# Patient Record
Sex: Female | Born: 1955
Health system: Southern US, Community
[De-identification: ages and names within clinical notes are randomized; demographics above are authoritative.]

## PROBLEM LIST (undated history)

## (undated) DIAGNOSIS — D649 Anemia, unspecified: Secondary | ICD-10-CM

## (undated) DIAGNOSIS — K8020 Calculus of gallbladder without cholecystitis without obstruction: Secondary | ICD-10-CM

## (undated) DIAGNOSIS — E785 Hyperlipidemia, unspecified: Secondary | ICD-10-CM

## (undated) DIAGNOSIS — R51 Headache: Secondary | ICD-10-CM

## (undated) DIAGNOSIS — K5790 Diverticulosis of intestine, part unspecified, without perforation or abscess without bleeding: Secondary | ICD-10-CM

## (undated) HISTORY — PX: TUBAL LIGATION: SHX77

## (undated) HISTORY — DX: Diverticulosis of intestine, part unspecified, without perforation or abscess without bleeding: K57.90

## (undated) HISTORY — DX: Anemia, unspecified: D64.9

## (undated) HISTORY — DX: Hyperlipidemia, unspecified: E78.5

## (undated) HISTORY — PX: OOPHORECTOMY: SHX86

## (undated) HISTORY — DX: Calculus of gallbladder without cholecystitis without obstruction: K80.20

## (undated) HISTORY — DX: Headache: R51

## (undated) HISTORY — PX: OTHER SURGICAL HISTORY: SHX169

## (undated) HISTORY — PX: TONSILLECTOMY: SUR1361

---

## 2001-01-22 ENCOUNTER — Other Ambulatory Visit: Admission: RE | Admit: 2001-01-22 | Discharge: 2001-01-22 | Payer: Self-pay | Admitting: Neurosurgery

## 2001-09-24 ENCOUNTER — Other Ambulatory Visit: Admission: RE | Admit: 2001-09-24 | Discharge: 2001-09-24 | Payer: Self-pay | Admitting: Internal Medicine

## 2001-12-12 ENCOUNTER — Encounter: Payer: Self-pay | Admitting: Internal Medicine

## 2001-12-12 ENCOUNTER — Encounter: Admission: RE | Admit: 2001-12-12 | Discharge: 2001-12-12 | Payer: Self-pay | Admitting: Internal Medicine

## 2002-10-21 ENCOUNTER — Other Ambulatory Visit: Admission: RE | Admit: 2002-10-21 | Discharge: 2002-10-21 | Payer: Self-pay | Admitting: Internal Medicine

## 2002-12-19 ENCOUNTER — Encounter: Admission: RE | Admit: 2002-12-19 | Discharge: 2002-12-19 | Payer: Self-pay | Admitting: Internal Medicine

## 2003-10-31 ENCOUNTER — Ambulatory Visit: Payer: Self-pay | Admitting: Internal Medicine

## 2003-11-18 ENCOUNTER — Ambulatory Visit: Payer: Self-pay | Admitting: Internal Medicine

## 2003-11-18 ENCOUNTER — Other Ambulatory Visit: Admission: RE | Admit: 2003-11-18 | Discharge: 2003-11-18 | Payer: Self-pay | Admitting: Internal Medicine

## 2003-11-26 ENCOUNTER — Ambulatory Visit: Payer: Self-pay | Admitting: Gastroenterology

## 2003-12-06 ENCOUNTER — Ambulatory Visit: Payer: Self-pay | Admitting: Internal Medicine

## 2003-12-08 ENCOUNTER — Ambulatory Visit: Payer: Self-pay | Admitting: Gastroenterology

## 2004-02-05 ENCOUNTER — Encounter: Admission: RE | Admit: 2004-02-05 | Discharge: 2004-02-05 | Payer: Self-pay | Admitting: Internal Medicine

## 2004-09-11 ENCOUNTER — Emergency Department (HOSPITAL_COMMUNITY): Admission: EM | Admit: 2004-09-11 | Discharge: 2004-09-12 | Payer: Self-pay | Admitting: Emergency Medicine

## 2004-09-13 ENCOUNTER — Ambulatory Visit: Payer: Self-pay | Admitting: Internal Medicine

## 2004-10-04 ENCOUNTER — Ambulatory Visit: Payer: Self-pay | Admitting: Internal Medicine

## 2005-01-06 ENCOUNTER — Ambulatory Visit: Payer: Self-pay | Admitting: Internal Medicine

## 2005-01-13 ENCOUNTER — Other Ambulatory Visit: Admission: RE | Admit: 2005-01-13 | Discharge: 2005-01-13 | Payer: Self-pay | Admitting: Neurosurgery

## 2005-01-13 ENCOUNTER — Encounter: Payer: Self-pay | Admitting: Internal Medicine

## 2005-01-13 ENCOUNTER — Ambulatory Visit: Payer: Self-pay | Admitting: Internal Medicine

## 2005-02-16 ENCOUNTER — Encounter: Admission: RE | Admit: 2005-02-16 | Discharge: 2005-02-16 | Payer: Self-pay | Admitting: Internal Medicine

## 2005-04-12 ENCOUNTER — Ambulatory Visit: Payer: Self-pay | Admitting: Cardiology

## 2005-09-29 ENCOUNTER — Ambulatory Visit: Payer: Self-pay | Admitting: Internal Medicine

## 2005-10-03 ENCOUNTER — Ambulatory Visit: Payer: Self-pay | Admitting: Internal Medicine

## 2005-10-20 ENCOUNTER — Other Ambulatory Visit: Admission: RE | Admit: 2005-10-20 | Discharge: 2005-10-20 | Payer: Self-pay | Admitting: Internal Medicine

## 2005-10-20 ENCOUNTER — Ambulatory Visit: Payer: Self-pay | Admitting: Internal Medicine

## 2005-10-20 ENCOUNTER — Encounter (INDEPENDENT_AMBULATORY_CARE_PROVIDER_SITE_OTHER): Payer: Self-pay | Admitting: *Deleted

## 2006-02-21 ENCOUNTER — Encounter: Admission: RE | Admit: 2006-02-21 | Discharge: 2006-02-21 | Payer: Self-pay | Admitting: Internal Medicine

## 2006-02-27 ENCOUNTER — Ambulatory Visit: Payer: Self-pay | Admitting: Internal Medicine

## 2006-02-27 LAB — CONVERTED CEMR LAB
ALT: 61 units/L — ABNORMAL HIGH (ref 0–40)
AST: 30 units/L (ref 0–37)
Alkaline Phosphatase: 89 units/L (ref 39–117)
BUN: 17 mg/dL (ref 6–23)
CO2: 31 meq/L (ref 19–32)
Chloride: 109 meq/L (ref 96–112)
Cholesterol: 253 mg/dL (ref 0–200)
Creatinine, Ser: 0.7 mg/dL (ref 0.4–1.2)
Potassium: 4.2 meq/L (ref 3.5–5.1)
Total Bilirubin: 0.8 mg/dL (ref 0.3–1.2)
Total CHOL/HDL Ratio: 2.7
Total Protein: 6.9 g/dL (ref 6.0–8.3)

## 2006-03-06 ENCOUNTER — Ambulatory Visit: Payer: Self-pay | Admitting: Internal Medicine

## 2006-11-01 ENCOUNTER — Ambulatory Visit: Payer: Self-pay | Admitting: Internal Medicine

## 2006-11-01 LAB — CONVERTED CEMR LAB
AST: 26 units/L (ref 0–37)
Albumin: 3.7 g/dL (ref 3.5–5.2)
Alkaline Phosphatase: 105 units/L (ref 39–117)
BUN: 10 mg/dL (ref 6–23)
Basophils Absolute: 0 10*3/uL (ref 0.0–0.1)
Basophils Relative: 0.7 % (ref 0.0–1.0)
CO2: 30 meq/L (ref 19–32)
Chloride: 107 meq/L (ref 96–112)
Creatinine, Ser: 0.7 mg/dL (ref 0.4–1.2)
Glucose, Urine, Semiquant: NEGATIVE
HCT: 41 % (ref 36.0–46.0)
Hemoglobin: 14.3 g/dL (ref 12.0–15.0)
LDL Cholesterol: 145 mg/dL
Monocytes Absolute: 0.7 10*3/uL (ref 0.2–0.7)
Neutrophils Relative %: 63.4 % (ref 43.0–77.0)
Nitrite: NEGATIVE
Potassium: 4.2 meq/L (ref 3.5–5.1)
RBC: 3.98 M/uL (ref 3.87–5.11)
RDW: 13.3 % (ref 11.5–14.6)
Sodium: 141 meq/L (ref 135–145)
TSH: 1.66 microintl units/mL (ref 0.35–5.50)
Total Bilirubin: 0.6 mg/dL (ref 0.3–1.2)
Total CHOL/HDL Ratio: 3
Total Protein: 6.3 g/dL (ref 6.0–8.3)
Triglycerides: 93 mg/dL (ref 0–149)
Urobilinogen, UA: 0.2
VLDL: 19 mg/dL (ref 0–40)
WBC: 5.4 10*3/uL (ref 4.5–10.5)

## 2007-01-23 ENCOUNTER — Ambulatory Visit: Payer: Self-pay | Admitting: Internal Medicine

## 2007-01-23 DIAGNOSIS — K648 Other hemorrhoids: Secondary | ICD-10-CM | POA: Insufficient documentation

## 2007-01-23 DIAGNOSIS — K5730 Diverticulosis of large intestine without perforation or abscess without bleeding: Secondary | ICD-10-CM | POA: Insufficient documentation

## 2007-01-23 DIAGNOSIS — E785 Hyperlipidemia, unspecified: Secondary | ICD-10-CM | POA: Insufficient documentation

## 2007-01-23 DIAGNOSIS — R519 Headache, unspecified: Secondary | ICD-10-CM | POA: Insufficient documentation

## 2007-01-23 DIAGNOSIS — R51 Headache: Secondary | ICD-10-CM | POA: Insufficient documentation

## 2007-01-23 DIAGNOSIS — M810 Age-related osteoporosis without current pathological fracture: Secondary | ICD-10-CM | POA: Insufficient documentation

## 2007-01-23 LAB — CONVERTED CEMR LAB: HDL goal, serum: 40 mg/dL

## 2007-03-08 ENCOUNTER — Encounter: Admission: RE | Admit: 2007-03-08 | Discharge: 2007-03-08 | Payer: Self-pay | Admitting: Internal Medicine

## 2007-04-17 ENCOUNTER — Ambulatory Visit: Payer: Self-pay | Admitting: Internal Medicine

## 2007-04-17 DIAGNOSIS — D649 Anemia, unspecified: Secondary | ICD-10-CM | POA: Insufficient documentation

## 2007-04-17 LAB — CONVERTED CEMR LAB
Basophils Relative: 0.5 % (ref 0.0–1.0)
Eosinophils Relative: 0.9 % (ref 0.0–5.0)
HCT: 42.4 % (ref 36.0–46.0)
Hemoglobin: 14.5 g/dL (ref 12.0–15.0)
Lymphocytes Relative: 28 % (ref 12.0–46.0)
Monocytes Absolute: 0.6 10*3/uL (ref 0.1–1.0)
Monocytes Relative: 9.4 % (ref 3.0–12.0)
Neutro Abs: 3.8 10*3/uL (ref 1.4–7.7)
RBC: 4.12 M/uL (ref 3.87–5.11)

## 2007-04-24 ENCOUNTER — Ambulatory Visit: Payer: Self-pay | Admitting: Internal Medicine

## 2007-04-24 DIAGNOSIS — E538 Deficiency of other specified B group vitamins: Secondary | ICD-10-CM | POA: Insufficient documentation

## 2007-04-24 DIAGNOSIS — E162 Hypoglycemia, unspecified: Secondary | ICD-10-CM | POA: Insufficient documentation

## 2007-04-30 ENCOUNTER — Ambulatory Visit: Payer: Self-pay | Admitting: Internal Medicine

## 2007-04-30 ENCOUNTER — Telehealth: Payer: Self-pay | Admitting: *Deleted

## 2007-04-30 LAB — CONVERTED CEMR LAB
Glucose, 1 Hour GTT: 104 mg/dL — ABNORMAL LOW (ref 120–170)
Glucose, 2 hour: 62 mg/dL — ABNORMAL LOW (ref 70–139)
Glucose, GTT - 3 Hour: 72 mg/dL

## 2007-05-10 ENCOUNTER — Encounter: Payer: Self-pay | Admitting: Internal Medicine

## 2007-05-29 ENCOUNTER — Telehealth: Payer: Self-pay | Admitting: Internal Medicine

## 2007-06-07 ENCOUNTER — Encounter: Payer: Self-pay | Admitting: Internal Medicine

## 2007-07-25 ENCOUNTER — Ambulatory Visit: Payer: Self-pay | Admitting: Internal Medicine

## 2007-07-25 LAB — CONVERTED CEMR LAB: Vit D, 1,25-Dihydroxy: 36 (ref 30–89)

## 2007-10-11 ENCOUNTER — Ambulatory Visit: Payer: Self-pay | Admitting: Internal Medicine

## 2007-10-17 ENCOUNTER — Telehealth: Payer: Self-pay | Admitting: Internal Medicine

## 2007-10-24 LAB — CONVERTED CEMR LAB: Vit D, 1,25-Dihydroxy: 31 (ref 30–89)

## 2007-12-03 ENCOUNTER — Telehealth: Payer: Self-pay | Admitting: Internal Medicine

## 2007-12-25 ENCOUNTER — Telehealth: Payer: Self-pay | Admitting: *Deleted

## 2008-02-29 ENCOUNTER — Ambulatory Visit: Payer: Self-pay | Admitting: Internal Medicine

## 2008-02-29 LAB — CONVERTED CEMR LAB
ALT: 64 units/L — ABNORMAL HIGH (ref 0–35)
AST: 51 units/L — ABNORMAL HIGH (ref 0–37)
Albumin: 4.2 g/dL (ref 3.5–5.2)
BUN: 19 mg/dL (ref 6–23)
Basophils Relative: 0.5 % (ref 0.0–3.0)
Blood in Urine, dipstick: NEGATIVE
CO2: 29 meq/L (ref 19–32)
Calcium: 9.5 mg/dL (ref 8.4–10.5)
Chloride: 106 meq/L (ref 96–112)
Creatinine, Ser: 0.6 mg/dL (ref 0.4–1.2)
Creatinine,U: 122.4 mg/dL
Eosinophils Absolute: 0.1 10*3/uL (ref 0.0–0.7)
Eosinophils Relative: 1.2 % (ref 0.0–5.0)
Glucose, Urine, Semiquant: NEGATIVE
HDL: 97.1 mg/dL (ref >39.0)
Hemoglobin: 15 g/dL (ref 12.0–15.0)
MCV: 102.3 fL — ABNORMAL HIGH (ref 78.0–100.0)
Neutro Abs: 2.5 10*3/uL (ref 1.4–7.7)
Neutrophils Relative %: 60.1 % (ref 43.0–77.0)
Nitrite: NEGATIVE
RBC: 4.3 M/uL (ref 3.87–5.11)
Specific Gravity, Urine: 1.025
WBC Urine, dipstick: NEGATIVE
WBC: 4.3 10*3/uL — ABNORMAL LOW (ref 4.5–10.5)
pH: 5

## 2008-03-12 ENCOUNTER — Ambulatory Visit: Payer: Self-pay | Admitting: Internal Medicine

## 2008-03-12 DIAGNOSIS — R079 Chest pain, unspecified: Secondary | ICD-10-CM | POA: Insufficient documentation

## 2008-03-12 DIAGNOSIS — R7401 Elevation of levels of liver transaminase levels: Secondary | ICD-10-CM | POA: Insufficient documentation

## 2008-03-12 DIAGNOSIS — R740 Nonspecific elevation of levels of transaminase and lactic acid dehydrogenase [LDH]: Secondary | ICD-10-CM

## 2008-03-13 ENCOUNTER — Telehealth: Payer: Self-pay | Admitting: Internal Medicine

## 2008-03-19 ENCOUNTER — Ambulatory Visit: Payer: Self-pay

## 2008-03-19 ENCOUNTER — Encounter: Payer: Self-pay | Admitting: Internal Medicine

## 2008-03-21 ENCOUNTER — Encounter: Admission: RE | Admit: 2008-03-21 | Discharge: 2008-03-21 | Payer: Self-pay | Admitting: Internal Medicine

## 2008-03-21 ENCOUNTER — Telehealth: Payer: Self-pay | Admitting: Internal Medicine

## 2008-03-24 ENCOUNTER — Telehealth (INDEPENDENT_AMBULATORY_CARE_PROVIDER_SITE_OTHER): Payer: Self-pay | Admitting: *Deleted

## 2009-01-21 ENCOUNTER — Ambulatory Visit: Payer: Self-pay | Admitting: Internal Medicine

## 2009-12-04 ENCOUNTER — Ambulatory Visit: Payer: Self-pay | Admitting: Internal Medicine

## 2009-12-04 LAB — CONVERTED CEMR LAB
ALT: 35 units/L (ref 0–35)
BUN: 16 mg/dL (ref 6–23)
Bilirubin Urine: NEGATIVE
Bilirubin, Direct: 0.1 mg/dL (ref 0.0–0.3)
Blood in Urine, dipstick: NEGATIVE
Chloride: 105 meq/L (ref 96–112)
Cholesterol: 232 mg/dL — ABNORMAL HIGH (ref 0–200)
Creatinine, Ser: 0.7 mg/dL (ref 0.4–1.2)
Creatinine,U: 183.7 mg/dL
Direct LDL: 112 mg/dL
Eosinophils Relative: 0.9 % (ref 0.0–5.0)
GFR calc non Af Amer: 98.18 mL/min (ref >60)
Glucose, Urine, Semiquant: NEGATIVE
HDL: 87.8 mg/dL (ref >39.00)
Hgb A1c MFr Bld: 5.3 % (ref 4.6–6.5)
Ketones, urine, test strip: NEGATIVE
MCV: 103.9 fL — ABNORMAL HIGH (ref 78.0–100.0)
Microalb Creat Ratio: 0.5 mg/g (ref 0.0–30.0)
Microalb, Ur: 0.9 mg/dL (ref 0.0–1.9)
Monocytes Absolute: 0.5 10*3/uL (ref 0.1–1.0)
Neutrophils Relative %: 63.2 % (ref 43.0–77.0)
Platelets: 244 10*3/uL (ref 150.0–400.0)
Protein, U semiquant: NEGATIVE
Total Bilirubin: 0.7 mg/dL (ref 0.3–1.2)
Triglycerides: 80 mg/dL (ref 0.0–149.0)
VLDL: 16 mg/dL (ref 0.0–40.0)
WBC: 4.8 10*3/uL (ref 4.5–10.5)
pH: 5

## 2009-12-09 ENCOUNTER — Ambulatory Visit: Payer: Self-pay | Admitting: Internal Medicine

## 2010-02-02 NOTE — Assessment & Plan Note (Signed)
Summary: CPX // RS   Vital Signs:  Patient profile:   55 year old female Height:      63 inches Weight:      158 pounds BMI:     28.09 Temp:     98.2 degrees F oral Pulse rate:   72 / minute Resp:     14 per minute BP sitting:   120 / 80  (left arm)  Vitals Entered By: Willy Eddy, LPN (December 09, 2009 3:02 PM) CC: cpx Is Patient Diabetic? No   CC:  cpx.  Preventive Screening-Counseling & Management  Alcohol-Tobacco     Smoking Status: quit     Year Quit: 2002     Passive Smoke Exposure: no  Current Problems (verified): 1)  Transaminases, Serum, Elevated  (ICD-790.4) 2)  Chest Pain Unspecified  (ICD-786.50) 3)  Osteoporosis  (ICD-733.00) 4)  Vitamin B12 Deficiency  (ICD-266.2) 5)  Reactive Hypoglycemia  (ICD-251.2) 6)  Anemia  (ICD-285.9) 7)  Preventive Health Care  (ICD-V70.0) 8)  Internal Hemorrhoids  (ICD-455.0) 9)  Diverticulosis, Colon  (ICD-562.10) 10)  Family History Diabetes 1st Degree Relative  (ICD-V18.0) 11)  Family History of Cad Female 1st Degree Relative <50  (ICD-V17.3) 12)  Headache  (ICD-784.0) 13)  Hyperlipidemia  (ICD-272.4) 14)  Osteoporosis  (ICD-733.00)  Current Medications (verified): 1)  Butalbital-Apap-Caffeine 50-500-40 Mg Tabs (Butalbital-Apap-Caffeine) .Marland Kitchen.. 1 Every 6-8 H Ours As Needed Headache  Allergies (verified): 1)  Asa  Contraindications/Deferment of Procedures/Staging:    Test/Procedure: FLU VAX    Reason for deferment: patient declined   Past History:  Family History: Last updated: 03/12/2008 mother Family History Hypertensionthe father died at 42 Family History of CAD Female 1st degree relative <50 Family History Diabetes 1st degree relative twoprother with stents for CAD  Social History: Last updated: 01/23/2007 Married Former Smoker Alcohol use-yes Drug use-no Regular exercise-yes  Risk Factors: Exercise: yes (01/23/2007)  Risk Factors: Smoking Status: quit (12/09/2009) Passive Smoke Exposure: no  (12/09/2009)  Past medical, surgical, family and social histories (including risk factors) reviewed, and no changes noted (except as noted below).  Past Medical History: Reviewed history from 10/11/2007 and no changes required. Osteoporosis Hyperlipidemia HSV Headache Diverticulosis, colon ? glaucoma  Past Surgical History: Reviewed history from 01/23/2007 and no changes required. Oophorectomy Tonsillectomy matuateratoma  Family History: Reviewed history from 03/12/2008 and no changes required. mother Family History Hypertensionthe father died at 67 Family History of CAD Female 1st degree relative <50 Family History Diabetes 1st degree relative twoprother with stents for CAD  Social History: Reviewed history from 01/23/2007 and no changes required. Married Former Smoker Alcohol use-yes Drug use-no Regular exercise-yes  Review of Systems  The patient denies anorexia, fever, weight loss, weight gain, vision loss, decreased hearing, hoarseness, chest pain, syncope, dyspnea on exertion, peripheral edema, prolonged cough, headaches, hemoptysis, abdominal pain, melena, hematochezia, severe indigestion/heartburn, hematuria, incontinence, genital sores, muscle weakness, suspicious skin lesions, transient blindness, difficulty walking, depression, unusual weight change, abnormal bleeding, enlarged lymph nodes, angioedema, and breast masses.    Physical Exam  General:  Well-developed,well-nourished,in no acute distress; alert,appropriate and cooperative throughout examination Head:  Normocephalic and atraumatic without obvious abnormalities. No apparent alopecia or balding. Eyes:  No corneal or conjunctival inflammation noted. EOMI. Perrla. Funduscopic exam benign, without hemorrhages, exudates or papilledema. Vision grossly normal. Ears:  R ear normal and L ear normal.   Nose:  no external deformity and no nasal discharge.   Mouth:  good dentition and pharynx pink and moist.  Neck:  No deformities, masses, or tenderness noted. Lungs:  Normal respiratory effort, chest expands symmetrically. Lungs are clear to auscultation, no crackles or wheezes. Heart:  Normal rate and regular rhythm. S1 and S2 normal without gallop, murmur, click, rub or other extra sounds. Abdomen:  soft and non-tender.   Msk:  No deformity or scoliosis noted of thoracic or lumbar spine.   Pulses:  R and L carotid,radial,femoral,dorsalis pedis and posterior tibial pulses are full and equal bilaterally Extremities:  No clubbing, cyanosis, edema, or deformity noted with normal full range of motion of all joints.   Neurologic:  alert & oriented X3 and cranial nerves II-XII intact.     Impression & Recommendations:  Problem # 1:  TRANSAMINASES, SERUM, ELEVATED (ICD-790.4) resolved  Problem # 2:  REACTIVE HYPOGLYCEMIA (ICD-251.2) stable  Problem # 3:  ANEMIA (ICD-285.9) resolved The following medications were removed from the medication list:    Vitamin B-12 Cr 1000 Mcg Tbcr (Cyanocobalamin) ..... One by mouth daily  Problem # 4:  HEADACHE (ICD-784.0)  Her updated medication list for this problem includes:    Butalbital-apap-caffeine 50-500-40 Mg Tabs (Butalbital-apap-caffeine) .Marland Kitchen... 2 every 6-8 h ours as needed headache  Headache diary reviewed.  Problem # 5:  PREVENTIVE HEALTH CARE (ICD-V70.0) The pt was asked about all immunizations, health maint. services that are appropriate to their age and was given guidance on diet exercize  and weight management  Mammogram: normal (04/02/2009) Pap smear: normal (04/02/2009) Colonoscopy: normal (01/02/2004) Td Booster: Td (03/28/2003)   Flu Vax: Fluvax Non-MCR (01/23/2007)   Chol: 232 (12/04/2009)   HDL: 87.80 (12/04/2009)   LDL: DEL (02/29/2008)   TG: 80.0 (12/04/2009) TSH: 1.70 (12/04/2009)   HgbA1C: 5.3 (12/04/2009)   Next mammogram due:: 04/2010 (12/09/2009) Next Colonoscopy due:: 01/2011 (12/09/2009)  Discussed using sunscreen, use of  alcohol, drug use, self breast exam, routine dental care, routine eye care, schedule for GYN exam, routine physical exam, seat belts, multiple vitamins, osteoporosis prevention, adequate calcium intake in diet, recommendations for immunizations, mammograms and Pap smears.  Discussed exercise and checking cholesterol.  Discussed gun safety, safe sex, and contraception.  Complete Medication List: 1)  Butalbital-apap-caffeine 50-500-40 Mg Tabs (Butalbital-apap-caffeine) .... 2 every 6-8 h ours as needed headache  Patient Instructions: 1)  Please schedule a follow-up appointment in 1 year. Prescriptions: BUTALBITAL-APAP-CAFFEINE 50-500-40 MG TABS (BUTALBITAL-APAP-CAFFEINE) 2 every 6-8 h ours as needed headache  #60 x 3   Entered and Authorized by:   Stacie Glaze MD   Signed by:   Stacie Glaze MD on 12/09/2009   Method used:   Electronically to        [facility] # [phone]* (retail)       [address]       [place], [place]  [address]       Ph: [phone]       Fax: [phone]   RxID:   [id number]    Orders Added: 1)  Est. Patient 40-64 years [99396] 2)  Est. Patient Level III [27253]     Preventive Care Screening  Colonoscopy:    Date:  01/02/2004    Next Due:  01/2011    Results:  normal   Pap Smear:    Date:  04/02/2009    Next Due:  04/2010    Results:  normal   Mammogram:    Date:  04/02/2009    Next Due:  04/2010    Results:  normal

## 2010-02-17 ENCOUNTER — Telehealth: Payer: Self-pay | Admitting: Internal Medicine

## 2010-02-17 DIAGNOSIS — R110 Nausea: Secondary | ICD-10-CM

## 2010-02-17 MED ORDER — CIPROFLOXACIN HCL 500 MG PO TABS: 500.0000 mg | ORAL_TABLET | Freq: Two times a day (BID) | ORAL | Status: AC

## 2010-02-17 MED ORDER — PROMETHAZINE HCL 12.5 MG PO TABS: 25.0000 mg | ORAL_TABLET | Freq: Four times a day (QID) | ORAL | Status: DC | PRN

## 2010-02-17 NOTE — Telephone Encounter (Signed)
Please advise 

## 2010-02-17 NOTE — Telephone Encounter (Signed)
cipro 500 bid for 7 days and  Phenergan 25 mg po prn number 10

## 2010-02-17 NOTE — Telephone Encounter (Signed)
ok 

## 2010-02-17 NOTE — Telephone Encounter (Signed)
Pt is going out of country [place]  for 1 wk she is requesting a trip kit. [facility] [address]

## 2010-05-21 NOTE — Assessment & Plan Note (Signed)
[facility]                                 ON-CALL NOTE   NAME:Anglemyer, Naelle                           MRN:          [id number]  DATE:11/05/2006                            DOB:          05/19/1955    TIME OF INTERACTION:  8:05 a.m.   PHONE NUMBER:  [phone].   SUBJECTIVE:  The patient has an earache with a sore throat.  Started  Friday, got better yesterday, and now is back again today.  She would  like a Z-PAK called in.   OBJECTIVE:  Probable URI, probable ear infection.   PLAN:  I tried to explain to her that we do not call in antibiotics over  the phone.  If she feels she needs to be seen today, she needs to go to  an acute care or the emergency room.  Otherwise, call in the morning for  an appointment if she is not improved, and they will be glad to see her.   PRIMARY CARE Johnjoseph Rolfe:  Dr. Lovell Sheehan.  Home office is [place].     Arta Silence, MD  Electronically Signed    RNS/MedQ  DD: 11/05/2006  DT: 11/06/2006  Job #: [id number]

## 2010-07-20 ENCOUNTER — Other Ambulatory Visit: Payer: Self-pay | Admitting: Obstetrics and Gynecology

## 2010-08-06 ENCOUNTER — Other Ambulatory Visit: Payer: Self-pay | Admitting: Internal Medicine

## 2010-12-08 ENCOUNTER — Telehealth: Payer: Self-pay | Admitting: Internal Medicine

## 2010-12-08 DIAGNOSIS — R109 Unspecified abdominal pain: Secondary | ICD-10-CM

## 2010-12-08 NOTE — Telephone Encounter (Addendum)
Pt is having abd pains last attack was Saturday at 530pm. Pt decline to see another MD. Pt would like ultrasound ?gallbladder problem.

## 2010-12-08 NOTE — Telephone Encounter (Signed)
Per dr Lovell Sheehan- may have [place] of abd -pt informed--instrcuted to go to er if increased abd pain

## 2010-12-10 ENCOUNTER — Ambulatory Visit
Admission: RE | Admit: 2010-12-10 | Discharge: 2010-12-10 | Disposition: A | Discharge status: Home / Self Care | Payer: BC Managed Care – PPO | Source: Ambulatory Visit | Attending: Internal Medicine | Admitting: Internal Medicine

## 2010-12-10 DIAGNOSIS — R109 Unspecified abdominal pain: Secondary | ICD-10-CM

## 2010-12-11 ENCOUNTER — Encounter: Payer: Self-pay | Admitting: Gastroenterology

## 2010-12-13 ENCOUNTER — Telehealth: Payer: Self-pay | Admitting: *Deleted

## 2010-12-13 NOTE — Telephone Encounter (Signed)
ok 

## 2011-01-27 ENCOUNTER — Telehealth: Payer: Self-pay | Admitting: Internal Medicine

## 2011-01-27 NOTE — Telephone Encounter (Signed)
Refill Butat/Apap to [facility]. Thanks.

## 2011-01-28 ENCOUNTER — Other Ambulatory Visit: Payer: Self-pay | Admitting: *Deleted

## 2011-01-28 MED ORDER — BUTALBITAL-APAP-CAFFEINE 50-500-40 MG PO TABS: 2.0000 | ORAL_TABLET | Freq: Four times a day (QID) | ORAL | Status: DC | PRN

## 2011-02-04 ENCOUNTER — Other Ambulatory Visit (INDEPENDENT_AMBULATORY_CARE_PROVIDER_SITE_OTHER): Payer: BC Managed Care – PPO

## 2011-02-04 DIAGNOSIS — Z0000 Encounter for general adult medical examination without abnormal findings: Secondary | ICD-10-CM

## 2011-02-04 LAB — POCT URINALYSIS DIPSTICK
Blood, UA: NEGATIVE
Glucose, UA: NEGATIVE
Nitrite, UA: NEGATIVE
Protein, UA: NEGATIVE
Urobilinogen, UA: 0.2

## 2011-02-04 LAB — HEPATIC FUNCTION PANEL
ALT: 40 U/L — ABNORMAL HIGH (ref 0–35)
AST: 22 U/L (ref 0–37)
Bilirubin, Direct: 0 mg/dL (ref 0.0–0.3)
Total Bilirubin: 0.5 mg/dL (ref 0.3–1.2)

## 2011-02-04 LAB — LIPID PANEL
Cholesterol: 218 mg/dL — ABNORMAL HIGH (ref 0–200)
HDL: 79.2 mg/dL (ref >39.00)
Total CHOL/HDL Ratio: 3
VLDL: 16.2 mg/dL (ref 0.0–40.0)

## 2011-02-04 LAB — CBC WITH DIFFERENTIAL/PLATELET
Basophils Absolute: 0 10*3/uL (ref 0.0–0.1)
Eosinophils Relative: 1.5 % (ref 0.0–5.0)
Monocytes Relative: 10.4 % (ref 3.0–12.0)
Neutrophils Relative %: 60.2 % (ref 43.0–77.0)
Platelets: 233 10*3/uL (ref 150.0–400.0)
WBC: 4.2 10*3/uL — ABNORMAL LOW (ref 4.5–10.5)

## 2011-02-04 LAB — BASIC METABOLIC PANEL
BUN: 12 mg/dL (ref 6–23)
Calcium: 9 mg/dL (ref 8.4–10.5)
Creatinine, Ser: 0.7 mg/dL (ref 0.4–1.2)
GFR: 88.43 mL/min (ref >60.00)

## 2011-02-11 ENCOUNTER — Ambulatory Visit (INDEPENDENT_AMBULATORY_CARE_PROVIDER_SITE_OTHER): Payer: BC Managed Care – PPO | Admitting: Internal Medicine

## 2011-02-11 ENCOUNTER — Encounter: Payer: Self-pay | Admitting: Internal Medicine

## 2011-02-11 DIAGNOSIS — Z0000 Encounter for general adult medical examination without abnormal findings: Secondary | ICD-10-CM

## 2011-02-11 DIAGNOSIS — K8020 Calculus of gallbladder without cholecystitis without obstruction: Secondary | ICD-10-CM

## 2011-02-11 DIAGNOSIS — R7402 Elevation of levels of lactic acid dehydrogenase (LDH): Secondary | ICD-10-CM

## 2011-02-11 DIAGNOSIS — R7401 Elevation of levels of liver transaminase levels: Secondary | ICD-10-CM

## 2011-02-11 NOTE — Patient Instructions (Addendum)
The patient is instructed to continue all medications as prescribed. Schedule followup with check out clerk upon leaving the clinic Cholelithiasis Cholelithiasis (also called gallstones) is a form of gallbladder disease where gallstones form in your gallbladder. The gallbladder is a non-essential organ that stores bile made in the liver, which helps digest fats. Gallstones begin as small crystals and slowly grow into stones. Gallstone pain occurs when the gallbladder spasms, and a gallstone is blocking the duct. Pain can also occur when a stone passes out of the duct.  Women are more likely to develop gallstones than men. Other factors that increase the risk of gallbladder disease are:  Having multiple pregnancies. Physicians sometimes advise removing diseased gallbladders before future pregnancies.   Obesity.   Diets heavy in fried foods and fat.   Increasing age (older than 91).   Prolonged use of medications containing female hormones.   Diabetes mellitus.   Rapid weight loss.   Family history of gallstones (heredity).  SYMPTOMS  Feeling sick to your stomach (nauseous).   Abdominal pain.   Yellowing of the skin (jaundice).   Sudden pain. It may persist from several minutes to several hours.   Worsening pain with deep breathing or when jarred.   Fever.   Tenderness to the touch.  In some cases, when gallstones do not move into the bile duct, people have no pain or symptoms. These are called "silent" gallstones. TREATMENT In severe cases, emergency surgery may be required. HOME CARE INSTRUCTIONS   Only take over-the-counter or prescription medicines for pain, discomfort, or fever as directed by your caregiver.   Follow a low-fat diet until seen again. Fat causes the gallbladder to contract, which can result in pain.   Follow up as instructed. Attacks are almost always recurrent and surgery is usually required for permanent treatment.  SEEK IMMEDIATE MEDICAL CARE IF:    Your pain increases and is not controlled by medications.   You have an oral temperature above 102 F (38.9 C), not controlled by medication.   You develop nausea and vomiting.  MAKE SURE YOU:   Understand these instructions.   Will watch your condition.   Will get help right away if you are not doing well or get worse.  Document Released: 12/16/2004 Document Revised: 09/01/2010 Document Reviewed: 02/18/2010 [facility] Patient Information 2012 [place], [place].

## 2011-02-11 NOTE — Progress Notes (Signed)
Subjective:    Patient ID: Amy Doyle, female    DOB: 1955-04-15, 56 y.o.   MRN: [id number]  HPI Patient is a 56 year old white female who presents for complete physical examination.  She has an history of gallbladder is another ultrasound done in 2002 and showed mild fatty liver  and hemangioma, after recent attack she had a repeat ultrasound showing about 10 small gallstones persistent fatty liver that had not progressed and hemangioma but has not increased in size.   Review of Systems  Constitutional: Negative for activity change, appetite change and fatigue.  HENT: Negative for ear pain, congestion, neck pain, postnasal drip and sinus pressure.   Eyes: Negative for redness and visual disturbance.  Respiratory: Negative for cough, shortness of breath and wheezing.   Gastrointestinal: Negative for abdominal pain and abdominal distention.  Genitourinary: Negative for dysuria, frequency and menstrual problem.  Musculoskeletal: Negative for myalgias, joint swelling and arthralgias.  Skin: Negative for rash and wound.  Neurological: Negative for dizziness, weakness and headaches.  Hematological: Negative for adenopathy. Does not bruise/bleed easily.  Psychiatric/Behavioral: Negative for sleep disturbance and decreased concentration.   Past Medical History  Diagnosis Date  . Osteoporosis   . Hyperlipidemia   . HSV (herpes simplex virus) anogenital infection   . Headache   . Diverticulosis   . Glaucoma     History   Social History  . Marital Status: Married    Spouse Name: N/A    Number of Children: N/A  . Years of Education: N/A   Occupational History  . Not on file.   Social History Main Topics  . Smoking status: Former Games developer  . Smokeless tobacco: Not on file  . Alcohol Use: Yes  . Drug Use: No  . Sexually Active: Not on file   Other Topics Concern  . Not on file   Social History Narrative  . No narrative on file    Past Surgical History  Procedure Date   . Oophorectomy   . Tonsillectomy   . Matuateratoma     Family History  Problem Relation Age of Onset  . Diabetes    . Hypertension Mother   . Heart disease Brother   . Heart disease Father     Allergies  Allergen Reactions  . Aspirin     REACTION: rash    Current Outpatient Prescriptions on File Prior to Visit  Medication Sig Dispense Refill  . butalbital-acetaminophen-caffeine (ESGIC PLUS) 50-500-40 MG per tablet Take 2 tablets by mouth every 6 (six) hours as needed for pain.  60 tablet  2  . promethazine (PHENERGAN) 12.5 MG tablet Take 2 tablets (25 mg total) by mouth every 6 (six) hours as needed for Nausea.  10 tablet  0    BP 110/70  Pulse 72  Temp 98.3 F (36.8 C)  Resp 16  Ht 5\' 3"  (1.6 m)  Wt 144 lb (65.318 kg)  BMI 25.51 kg/m2       Objective:   Physical Exam  Constitutional: She is oriented to person, place, and time. She appears well-developed and well-nourished. No distress.  HENT:  Head: Normocephalic and atraumatic.  Right Ear: External ear normal.  Left Ear: External ear normal.  Nose: Nose normal.  Mouth/Throat: Oropharynx is clear and moist.  Eyes: Conjunctivae and EOM are normal. Pupils are equal, round, and reactive to light.  Neck: Normal range of motion. Neck supple. No JVD present. No tracheal deviation present. No thyromegaly present.  Cardiovascular: Normal rate, regular rhythm,  normal heart sounds and intact distal pulses.   No murmur heard. Pulmonary/Chest: Effort normal and breath sounds normal. She has no wheezes. She exhibits no tenderness.  Abdominal: Soft. Bowel sounds are normal.  Musculoskeletal: Normal range of motion. She exhibits no edema and no tenderness.  Lymphadenopathy:    She has no cervical adenopathy.  Neurological: She is alert and oriented to person, place, and time. She has normal reflexes. No cranial nerve deficit.  Skin: Skin is warm and dry. She is not diaphoretic.  Psychiatric: She has a normal mood and  affect. Her behavior is normal.          Assessment & Plan:   This is a routine physical examination for this healthy  Female. Reviewed all health maintenance protocols including mammography colonoscopy bone density and reviewed appropriate screening labs. Her immunization history was reviewed as well as her current medications and allergies refills of her chronic medications were given and the plan for yearly health maintenance was discussed all orders and referrals were made as appropriate.   Refer to general surgery for evaluation of gallstones.  Continue weight loss for fatty liver.  Anemia stable.  Moderate transaminase elevation was probably related to recent passage of a stone since she is losing weight and her liver appears to be stable I did not believe that this is continuing to image her fatty liver but we have besides continuing weight loss.

## 2011-02-16 ENCOUNTER — Encounter (INDEPENDENT_AMBULATORY_CARE_PROVIDER_SITE_OTHER): Payer: Self-pay | Admitting: Surgery

## 2011-02-24 ENCOUNTER — Encounter (INDEPENDENT_AMBULATORY_CARE_PROVIDER_SITE_OTHER): Payer: Self-pay | Admitting: Surgery

## 2011-02-24 ENCOUNTER — Ambulatory Visit (INDEPENDENT_AMBULATORY_CARE_PROVIDER_SITE_OTHER): Payer: BC Managed Care – PPO | Admitting: Surgery

## 2011-02-24 DIAGNOSIS — K8020 Calculus of gallbladder without cholecystitis without obstruction: Secondary | ICD-10-CM

## 2011-02-24 NOTE — Patient Instructions (Signed)
Cholelithiasis Cholelithiasis (also called gallstones) is a form of gallbladder disease where gallstones form in your gallbladder. The gallbladder is a non-essential organ that stores bile made in the liver, which helps digest fats. Gallstones begin as small crystals and slowly grow into stones. Gallstone pain occurs when the gallbladder spasms, and a gallstone is blocking the duct. Pain can also occur when a stone passes out of the duct.  Women are more likely to develop gallstones than men. Other factors that increase the risk of gallbladder disease are:  Having multiple pregnancies. Physicians sometimes advise removing diseased gallbladders before future pregnancies.   Obesity.   Diets heavy in fried foods and fat.   Increasing age (older than 60).   Prolonged use of medications containing female hormones.   Diabetes mellitus.   Rapid weight loss.   Family history of gallstones (heredity).  SYMPTOMS  Feeling sick to your stomach (nauseous).   Abdominal pain.   Yellowing of the skin (jaundice).   Sudden pain. It may persist from several minutes to several hours.   Worsening pain with deep breathing or when jarred.   Fever.   Tenderness to the touch.  In some cases, when gallstones do not move into the bile duct, people have no pain or symptoms. These are called "silent" gallstones. TREATMENT In severe cases, emergency surgery may be required. HOME CARE INSTRUCTIONS   Only take over-the-counter or prescription medicines for pain, discomfort, or fever as directed by your caregiver.   Follow a low-fat diet until seen again. Fat causes the gallbladder to contract, which can result in pain.   Follow up as instructed. Attacks are almost always recurrent and surgery is usually required for permanent treatment.  SEEK IMMEDIATE MEDICAL CARE IF:   Your pain increases and is not controlled by medications.   You have an oral temperature above 102 F (38.9 C), not controlled by  medication.   You develop nausea and vomiting.  MAKE SURE YOU:   Understand these instructions.   Will watch your condition.   Will get help right away if you are not doing well or get worse.  Document Released: 12/16/2004 Document Revised: 09/01/2010 Document Reviewed: 02/18/2010 [employer] Patient Information 2012 [employer].   Laparoscopic Cholecystectomy Laparoscopic cholecystectomy is surgery to remove the gallbladder. The gallbladder is located slightly to the right of center in the abdomen, behind the liver. It is a concentrating and storage sac for the bile produced in the liver. Bile aids in the digestion and absorption of fats. Gallbladder disease (cholecystitis) is an inflammation of your gallbladder. This condition is usually caused by a buildup of gallstones (cholelithiasis) in your gallbladder. Gallstones can block the flow of bile, resulting in inflammation and pain. In severe cases, emergency surgery may be required. When emergency surgery is not required, you will have time to prepare for the procedure. Laparoscopic surgery is an alternative to open surgery. Laparoscopic surgery usually has a shorter recovery time. Your common bile duct may also need to be examined and explored. Your caregiver will discuss this with you if he or she feels this should be done. If stones are found in the common bile duct, they may be removed. LET YOUR CAREGIVER KNOW ABOUT:  Allergies to food or medicine.   Medicines taken, including vitamins, herbs, eyedrops, over-the-counter medicines, and creams.   Use of steroids (by mouth or creams).   Previous problems with anesthetics or numbing medicines.   History of bleeding problems or blood clots.   Previous surgery.     Other health problems, including diabetes and kidney problems.   Possibility of pregnancy, if this applies.  RISKS AND COMPLICATIONS All surgery is associated with risks. Some problems that may occur following this  procedure include:  Infection.   Damage to the common bile duct, nerves, arteries, veins, or other internal organs such as the stomach or intestines.   Bleeding.   A stone may remain in the common bile duct.  BEFORE THE PROCEDURE  Do not take aspirin for 3 days prior to surgery or blood thinners for 1 week prior to surgery.   Do not eat or drink anything after midnight the night before surgery.   Let your caregiver know if you develop a cold or other infectious problem prior to surgery.   You should be present 60 minutes before the procedure or as directed.  PROCEDURE  You will be given medicine that makes you sleep (general anesthetic). When you are asleep, your surgeon will make several small cuts (incisions) in your abdomen. One of these incisions is used to insert a small, lighted scope (laparoscope) into the abdomen. The laparoscope helps the surgeon see into your abdomen. Carbon dioxide gas will be pumped into your abdomen. The gas allows more room for the surgeon to perform your surgery. Other operating instruments are inserted through the other incisions. Laparoscopic procedures may not be appropriate when:  There is major scarring from previous surgery.   The gallbladder is extremely inflamed.   There are bleeding disorders or unexpected cirrhosis of the liver.   A pregnancy is near term.   Other conditions make the laparoscopic procedure impossible.  If your surgeon feels it is not safe to continue with a laparoscopic procedure, he or she will perform an open abdominal procedure. In this case, the surgeon will make an incision to open the abdomen. This gives the surgeon a larger view and field to work within. This may allow the surgeon to perform procedures that sometimes cannot be performed with a laparoscope alone. Open surgery has a longer recovery time. AFTER THE PROCEDURE  You will be taken to the recovery area where a nurse will watch and check your progress.   You  may be allowed to go home the same day.   Do not resume physical activities until directed by your caregiver.   You may resume a normal diet and activities as directed.  Document Released: 12/20/2004 Document Revised: 09/01/2010 Document Reviewed: 06/04/2010 [employer] Patient Information 2012 [employer]. 

## 2011-02-24 NOTE — Progress Notes (Signed)
Patient ID: Amy Doyle, female   DOB: 11-Apr-1955, 56 y.o.   MRN: [id number]  Chief Complaint  Patient presents with  . Abdominal Pain    new pt- eval GB    HPI Amy Doyle is a 56 y.o. female.   HPI The patient is sent at the request of Dr. Lovell Sheehan due to abdominal pain and gallstones. She has had 2 attacks of right upper quadrant pain and epigastric pain. The pain was sharp in nature and radiated to her back. The pain lasted 4-6 hours on each occasion stopped. It was associated with nausea and vomiting. Ultrasound shows gallstones without inflammation.  Past Medical History  Diagnosis Date  . Osteoporosis   . Hyperlipidemia   . HSV (herpes simplex virus) anogenital infection   . Headache   . Diverticulosis   . Glaucoma   . Anemia   . Gallstones     Past Surgical History  Procedure Date  . Oophorectomy     right  . Tonsillectomy   . Matuateratoma   . Tubal ligation     Family History  Problem Relation Age of Onset  . Diabetes    . Hypertension Mother   . Heart disease Brother   . Heart disease Father   . Cancer Paternal Grandmother     breast    Social History History  Substance Use Topics  . Smoking status: Former Games developer  . Smokeless tobacco: Not on file  . Alcohol Use: Yes    Allergies  Allergen Reactions  . Aspirin     REACTION: rash    Current Outpatient Prescriptions  Medication Sig Dispense Refill  . butalbital-acetaminophen-caffeine (ESGIC PLUS) 50-500-40 MG per tablet Take 2 tablets by mouth every 6 (six) hours as needed for pain.  60 tablet  2  . Calcium Carbonate-Vit D-Min (CALTRATE PLUS PO) Take 600 mg by mouth daily.      . Calcium-Magnesium-Vitamin D (CALCIUM MAGNESIUM PO) Take by mouth daily.      . Cholecalciferol (VITAMIN D PO) Take 2,000 mg by mouth daily.      . Multiple Vitamin (MULTIVITAMIN) capsule Take 1 capsule by mouth daily.        Review of Systems Review of Systems  Constitutional: Negative for fever, chills and unexpected  weight change.  HENT: Negative for hearing loss, congestion, sore throat, trouble swallowing and voice change.   Eyes: Negative for visual disturbance.  Respiratory: Negative for cough and wheezing.   Cardiovascular: Negative for chest pain, palpitations and leg swelling.  Gastrointestinal: Negative for nausea, vomiting, abdominal pain, diarrhea, constipation, blood in stool, abdominal distention and anal bleeding.  Genitourinary: Negative for hematuria, vaginal bleeding and difficulty urinating.  Musculoskeletal: Negative for arthralgias.  Skin: Negative for rash and wound.  Neurological: Negative for seizures, syncope and headaches.  Hematological: Negative for adenopathy. Does not bruise/bleed easily.  Psychiatric/Behavioral: Negative for confusion.    Blood pressure 116/84, pulse 72, temperature 98.4 F (36.9 C), temperature source Temporal, resp. rate 16, height 5\' 3"  (1.6 m), weight 142 lb 6.4 oz (64.592 kg).  Physical Exam Physical Exam  Constitutional: She is oriented to person, place, and time. She appears well-developed and well-nourished.  HENT:  Head: Normocephalic and atraumatic.  Eyes: EOM are normal. Pupils are equal, round, and reactive to light.  Neck: Normal range of motion. Neck supple.  Cardiovascular: Normal rate and regular rhythm.   Pulmonary/Chest: Effort normal and breath sounds normal.  Abdominal: Soft. Bowel sounds are normal. There is no  tenderness.  Musculoskeletal: Normal range of motion.  Neurological: She is alert and oriented to person, place, and time.  Skin: Skin is warm and dry.  Psychiatric: She has a normal mood and affect. Her behavior is normal. Judgment and thought content normal.    Data Reviewed U/s gallstones  Assessment    Symptomatic cholelithiasis    Plan    Recommend laparoscopic cholecystectomy with intraoperative cholangiogram. She would like to proceed.The procedure has been discussed with the patient. Operative and non  operative treatments have been discussed. Risks of surgery include bleeding, infection,  Common bile duct injury,  Injury to the stomach,liver, colon,small intestine, abdominal wall,  Diaphragm,  Major blood vessels,  And the need for an open procedure.  Other risks include worsening of medical problems, death,  DVT and pulmonary embolism, and cardiovascular events.   Medical options have also been discussed. The patient has been informed of long term expectations of surgery and non surgical options,  The patient agrees to proceed.         Timira Bieda A. 02/24/2011, 2:55 PM

## 2011-02-25 ENCOUNTER — Telehealth: Payer: Self-pay | Admitting: *Deleted

## 2011-02-25 NOTE — Telephone Encounter (Signed)
Ov with dr Lovell Sheehan after surgery--last visit was for physical

## 2011-02-25 NOTE — Telephone Encounter (Signed)
Spoke with patient.

## 2011-02-25 NOTE — Telephone Encounter (Signed)
Patient is calling because she is going to have gall bladder surgery 03/10/11.  She would like to know when to schedule an appointment with Dr Lovell Sheehan.  She also forgot the diagnosis for her last office visit and would like a call back

## 2011-02-28 ENCOUNTER — Ambulatory Visit (INDEPENDENT_AMBULATORY_CARE_PROVIDER_SITE_OTHER): Payer: BC Managed Care – PPO | Admitting: Surgery

## 2011-03-04 ENCOUNTER — Ambulatory Visit (INDEPENDENT_AMBULATORY_CARE_PROVIDER_SITE_OTHER): Payer: BC Managed Care – PPO | Admitting: Surgery

## 2011-03-07 ENCOUNTER — Ambulatory Visit (INDEPENDENT_AMBULATORY_CARE_PROVIDER_SITE_OTHER): Payer: BC Managed Care – PPO | Admitting: Surgery

## 2011-03-10 ENCOUNTER — Other Ambulatory Visit (INDEPENDENT_AMBULATORY_CARE_PROVIDER_SITE_OTHER): Payer: Self-pay | Admitting: Surgery

## 2011-03-10 DIAGNOSIS — K8010 Calculus of gallbladder with chronic cholecystitis without obstruction: Secondary | ICD-10-CM

## 2011-03-10 HISTORY — PX: CHOLECYSTECTOMY: SHX55

## 2011-03-15 ENCOUNTER — Telehealth (INDEPENDENT_AMBULATORY_CARE_PROVIDER_SITE_OTHER): Payer: Self-pay

## 2011-03-15 ENCOUNTER — Encounter (INDEPENDENT_AMBULATORY_CARE_PROVIDER_SITE_OTHER): Payer: Self-pay | Admitting: Surgery

## 2011-03-15 NOTE — Telephone Encounter (Signed)
Pt called wanting to know what she could take for pain po gb that is non narcotic. Pt states she will be taking a 6 hr trip later this week. Pt states she cannot take aspirin. Pt advised to change to tylenol or she can check with pharmacist to see if she can take advil since she has aspirin allergy. Pt states she will check with pharmacy and or take tylenol.

## 2011-03-28 ENCOUNTER — Ambulatory Visit (INDEPENDENT_AMBULATORY_CARE_PROVIDER_SITE_OTHER): Payer: BC Managed Care – PPO | Admitting: Surgery

## 2011-03-28 ENCOUNTER — Encounter (INDEPENDENT_AMBULATORY_CARE_PROVIDER_SITE_OTHER): Payer: Self-pay | Admitting: Surgery

## 2011-03-28 VITALS — BP 120/78 | HR 56 | Temp 99.3°F | Resp 18 | Ht 63.0 in | Wt 142.5 lb

## 2011-03-28 DIAGNOSIS — Z9889 Other specified postprocedural states: Secondary | ICD-10-CM

## 2011-03-28 NOTE — Patient Instructions (Signed)
Follow up as needed

## 2011-03-28 NOTE — Progress Notes (Signed)
she is here for a postop visit following laparoscopic cholecystectomy.  Diet is being tolerated, bowels are moving.  No problems with incisions.   Some mild epigastric discomfort  ZO:XWRU abdomen  ABD:  Soft, incisions clean/dry/intact and solid.  Assessment:  Doing well postop.  Plan:  Lowfat diet recommended.  Activities as tolerated.  Return visit prn.

## 2011-08-09 ENCOUNTER — Other Ambulatory Visit: Payer: Self-pay | Admitting: Obstetrics and Gynecology

## 2011-08-12 ENCOUNTER — Encounter: Payer: Self-pay | Admitting: Internal Medicine

## 2011-08-12 ENCOUNTER — Ambulatory Visit (INDEPENDENT_AMBULATORY_CARE_PROVIDER_SITE_OTHER): Payer: BC Managed Care – PPO | Admitting: Internal Medicine

## 2011-08-12 VITALS — BP 124/80 | HR 72 | Temp 98.2°F | Resp 16 | Ht 63.0 in | Wt 142.0 lb

## 2011-08-12 DIAGNOSIS — R7402 Elevation of levels of lactic acid dehydrogenase (LDH): Secondary | ICD-10-CM

## 2011-08-12 DIAGNOSIS — E785 Hyperlipidemia, unspecified: Secondary | ICD-10-CM

## 2011-08-12 DIAGNOSIS — M899 Disorder of bone, unspecified: Secondary | ICD-10-CM

## 2011-08-12 DIAGNOSIS — R7401 Elevation of levels of liver transaminase levels: Secondary | ICD-10-CM

## 2011-08-12 DIAGNOSIS — M8580 Other specified disorders of bone density and structure, unspecified site: Secondary | ICD-10-CM

## 2011-08-12 DIAGNOSIS — Z13820 Encounter for screening for osteoporosis: Secondary | ICD-10-CM

## 2011-08-12 DIAGNOSIS — D649 Anemia, unspecified: Secondary | ICD-10-CM

## 2011-08-12 LAB — CBC WITH DIFFERENTIAL/PLATELET
Basophils Absolute: 0 10*3/uL (ref 0.0–0.1)
Eosinophils Absolute: 0 10*3/uL (ref 0.0–0.7)
Eosinophils Relative: 0.8 % (ref 0.0–5.0)
HCT: 45.3 % (ref 36.0–46.0)
Lymphs Abs: 1.2 10*3/uL (ref 0.7–4.0)
MCHC: 32.8 g/dL (ref 30.0–36.0)
MCV: 104.4 fl — ABNORMAL HIGH (ref 78.0–100.0)
Monocytes Absolute: 0.6 10*3/uL (ref 0.1–1.0)
Neutrophils Relative %: 64.2 % (ref 43.0–77.0)
Platelets: 245 10*3/uL (ref 150.0–400.0)
RDW: 14.1 % (ref 11.5–14.6)

## 2011-08-12 LAB — LIPID PANEL
Cholesterol: 231 mg/dL — ABNORMAL HIGH (ref 0–200)
Total CHOL/HDL Ratio: 2
Triglycerides: 146 mg/dL (ref 0.0–149.0)

## 2011-08-12 LAB — HEPATIC FUNCTION PANEL
ALT: 45 U/L — ABNORMAL HIGH (ref 0–35)
AST: 28 U/L (ref 0–37)
Alkaline Phosphatase: 112 U/L (ref 39–117)
Bilirubin, Direct: 0 mg/dL (ref 0.0–0.3)
Total Bilirubin: 0.5 mg/dL (ref 0.3–1.2)

## 2011-08-12 MED ORDER — CHOLECALCIFEROL 25 MCG (1000 UT) PO CAPS: 1000.0000 [IU] | ORAL_CAPSULE | Freq: Every day | ORAL | Status: DC

## 2011-08-12 MED ORDER — TRAMADOL HCL 50 MG PO TABS: 50.0000 mg | ORAL_TABLET | Freq: Three times a day (TID) | ORAL | Status: DC | PRN

## 2011-08-12 NOTE — Progress Notes (Signed)
Subjective:    Patient ID: Amy Doyle, female    DOB: 19-Jun-1955, 56 y.o.   MRN: [id number]  HPI  Post surgery for gall stones Stable reviewed hx of elevated liver functions Anemia Lipid reviewed   Review of Systems  Constitutional: Negative for activity change, appetite change and fatigue.  HENT: Negative for ear pain, congestion, neck pain, postnasal drip and sinus pressure.   Eyes: Negative for redness and visual disturbance.  Respiratory: Negative for cough, shortness of breath and wheezing.   Gastrointestinal: Negative for abdominal pain and abdominal distention.  Genitourinary: Negative for dysuria, frequency and menstrual problem.  Musculoskeletal: Negative for myalgias, joint swelling and arthralgias.  Skin: Negative for rash and wound.  Neurological: Negative for dizziness, weakness and headaches.  Hematological: Negative for adenopathy. Does not bruise/bleed easily.  Psychiatric/Behavioral: Negative for disturbed wake/sleep cycle and decreased concentration.   Past Medical History  Diagnosis Date  . Osteoporosis   . Hyperlipidemia   . HSV (herpes simplex virus) anogenital infection   . Headache   . Diverticulosis   . Glaucoma   . Anemia   . Gallstones     History   Social History  . Marital Status: Married    Spouse Name: N/A    Number of Children: N/A  . Years of Education: N/A   Occupational History  . Not on file.   Social History Main Topics  . Smoking status: Former Games developer  . Smokeless tobacco: Not on file  . Alcohol Use: Yes  . Drug Use: No  . Sexually Active: Not on file   Other Topics Concern  . Not on file   Social History Narrative  . No narrative on file    Past Surgical History  Procedure Date  . Oophorectomy     right  . Tonsillectomy   . Matuateratoma   . Tubal ligation   . Cholecystectomy 03/10/2011    Family History  Problem Relation Age of Onset  . Diabetes    . Hypertension Mother   . Heart disease Brother   .  Heart disease Father   . Cancer Paternal Grandmother     breast    Allergies  Allergen Reactions  . Aspirin     REACTION: rash    Current Outpatient Prescriptions on File Prior to Visit  Medication Sig Dispense Refill  . butalbital-acetaminophen-caffeine (ESGIC PLUS) 50-500-40 MG per tablet Take 2 tablets by mouth every 6 (six) hours as needed for pain.  60 tablet  2    BP 124/80  Pulse 72  Temp 98.2 F (36.8 C)  Resp 16  Ht 5\' 3"  (1.6 m)  Wt 142 lb (64.411 kg)  BMI 25.15 kg/m2       Objective:   Physical Exam  Nursing note and vitals reviewed. Constitutional: She is oriented to person, place, and time. She appears well-developed and well-nourished. No distress.  HENT:  Head: Normocephalic and atraumatic.  Right Ear: External ear normal.  Left Ear: External ear normal.  Nose: Nose normal.  Mouth/Throat: Oropharynx is clear and moist.  Eyes: Conjunctivae and EOM are normal. Pupils are equal, round, and reactive to light.  Neck: Normal range of motion. Neck supple. No JVD present. No tracheal deviation present. No thyromegaly present.  Cardiovascular: Normal rate, regular rhythm, normal heart sounds and intact distal pulses.   No murmur heard. Pulmonary/Chest: Effort normal and breath sounds normal. She has no wheezes. She exhibits no tenderness.  Abdominal: Soft. Bowel sounds are normal.  Musculoskeletal:  Normal range of motion. She exhibits no edema and no tenderness.  Lymphadenopathy:    She has no cervical adenopathy.  Neurological: She is alert and oriented to person, place, and time. She has normal reflexes. No cranial nerve deficit.  Skin: Skin is warm and dry. She is not diaphoretic.  Psychiatric: She has a normal mood and affect. Her behavior is normal.          Assessment & Plan:

## 2011-08-12 NOTE — Patient Instructions (Addendum)
The Ultram is a nonnarcotic pain reliever. Taken with Tylenol it should be as effective or more effective than the butalbital without side effect.

## 2011-08-16 ENCOUNTER — Ambulatory Visit (INDEPENDENT_AMBULATORY_CARE_PROVIDER_SITE_OTHER)
Admission: RE | Admit: 2011-08-16 | Discharge: 2011-08-16 | Disposition: A | Discharge status: Home / Self Care | Payer: BC Managed Care – PPO | Source: Ambulatory Visit

## 2011-08-16 DIAGNOSIS — M8580 Other specified disorders of bone density and structure, unspecified site: Secondary | ICD-10-CM

## 2011-08-16 DIAGNOSIS — M899 Disorder of bone, unspecified: Secondary | ICD-10-CM

## 2011-08-16 DIAGNOSIS — M949 Disorder of cartilage, unspecified: Secondary | ICD-10-CM

## 2011-08-16 DIAGNOSIS — Z13820 Encounter for screening for osteoporosis: Secondary | ICD-10-CM

## 2011-10-06 ENCOUNTER — Other Ambulatory Visit: Payer: Self-pay | Admitting: Dermatology

## 2011-10-20 ENCOUNTER — Ambulatory Visit: Payer: BC Managed Care – PPO | Admitting: Internal Medicine

## 2011-11-30 ENCOUNTER — Ambulatory Visit: Payer: BC Managed Care – PPO | Admitting: Internal Medicine

## 2012-01-31 ENCOUNTER — Other Ambulatory Visit: Payer: BC Managed Care – PPO

## 2012-02-27 ENCOUNTER — Other Ambulatory Visit (INDEPENDENT_AMBULATORY_CARE_PROVIDER_SITE_OTHER): Payer: BC Managed Care – PPO

## 2012-02-27 DIAGNOSIS — Z0000 Encounter for general adult medical examination without abnormal findings: Secondary | ICD-10-CM

## 2012-02-27 LAB — CBC WITH DIFFERENTIAL/PLATELET
Basophils Absolute: 0 10*3/uL (ref 0.0–0.1)
Hemoglobin: 14.7 g/dL (ref 12.0–15.0)
Lymphocytes Relative: 19 % (ref 12.0–46.0)
Monocytes Relative: 9.5 % (ref 3.0–12.0)
Neutro Abs: 4 10*3/uL (ref 1.4–7.7)
Neutrophils Relative %: 70 % (ref 43.0–77.0)
RBC: 4.34 Mil/uL (ref 3.87–5.11)
RDW: 13.5 % (ref 11.5–14.6)

## 2012-02-27 LAB — POCT URINALYSIS DIPSTICK
Blood, UA: NEGATIVE
Protein, UA: NEGATIVE
Spec Grav, UA: >=1.03
Urobilinogen, UA: 0.2
pH, UA: 5

## 2012-02-27 LAB — HEPATIC FUNCTION PANEL
ALT: 44 U/L — ABNORMAL HIGH (ref 0–35)
AST: 30 U/L (ref 0–37)
Albumin: 4 g/dL (ref 3.5–5.2)
Alkaline Phosphatase: 101 U/L (ref 39–117)
Total Protein: 7.3 g/dL (ref 6.0–8.3)

## 2012-02-27 LAB — BASIC METABOLIC PANEL
Calcium: 9.1 mg/dL (ref 8.4–10.5)
GFR: 95.74 mL/min (ref >60.00)
Glucose, Bld: 80 mg/dL (ref 70–99)
Sodium: 141 mEq/L (ref 135–145)

## 2012-02-27 LAB — TSH: TSH: 1.5 u[IU]/mL (ref 0.35–5.50)

## 2012-02-27 LAB — LIPID PANEL
HDL: 93.1 mg/dL (ref >39.00)
Triglycerides: 98 mg/dL (ref 0.0–149.0)
VLDL: 19.6 mg/dL (ref 0.0–40.0)

## 2012-02-27 LAB — LDL CHOLESTEROL, DIRECT: Direct LDL: 104.1 mg/dL

## 2012-03-05 ENCOUNTER — Ambulatory Visit (INDEPENDENT_AMBULATORY_CARE_PROVIDER_SITE_OTHER): Payer: BC Managed Care – PPO | Admitting: Internal Medicine

## 2012-03-05 ENCOUNTER — Encounter: Payer: Self-pay | Admitting: Internal Medicine

## 2012-03-05 VITALS — BP 124/74 | HR 72 | Temp 98.6°F | Resp 16 | Ht 63.0 in | Wt 152.0 lb

## 2012-03-05 DIAGNOSIS — G44001 Cluster headache syndrome, unspecified, intractable: Secondary | ICD-10-CM

## 2012-03-05 DIAGNOSIS — G44009 Cluster headache syndrome, unspecified, not intractable: Secondary | ICD-10-CM

## 2012-03-05 DIAGNOSIS — Z0000 Encounter for general adult medical examination without abnormal findings: Secondary | ICD-10-CM

## 2012-03-05 MED ORDER — BUTALBITAL-APAP-CAFFEINE 50-325-40 MG PO TABS: 1.0000 | ORAL_TABLET | Freq: Two times a day (BID) | ORAL | Status: DC | PRN

## 2012-03-05 NOTE — Progress Notes (Signed)
Subjective:    Patient ID: Amy Doyle, female    DOB: 01/25/1955, 57 y.o.   MRN: [id number]  HPI  CPX Hx of migraines  Review of Systems  Constitutional: Negative for activity change, appetite change and fatigue.  HENT: Negative for ear pain, congestion, neck pain, postnasal drip and sinus pressure.   Eyes: Negative for redness and visual disturbance.  Respiratory: Negative for cough, shortness of breath and wheezing.   Gastrointestinal: Negative for abdominal pain and abdominal distention.  Genitourinary: Negative for dysuria, frequency and menstrual problem.  Musculoskeletal: Negative for myalgias, joint swelling and arthralgias.  Skin: Negative for rash and wound.  Neurological: Negative for dizziness, weakness and headaches.  Hematological: Negative for adenopathy. Does not bruise/bleed easily.  Psychiatric/Behavioral: Negative for sleep disturbance and decreased concentration.   Past Medical History  Diagnosis Date  . Osteoporosis   . Hyperlipidemia   . HSV (herpes simplex virus) anogenital infection   . Headache   . Diverticulosis   . Glaucoma(365)   . Anemia   . Gallstones     History   Social History  . Marital Status: Married    Spouse Name: N/A    Number of Children: N/A  . Years of Education: N/A   Occupational History  . Not on file.   Social History Main Topics  . Smoking status: Former Games developer  . Smokeless tobacco: Not on file  . Alcohol Use: Yes  . Drug Use: No  . Sexually Active: Not on file   Other Topics Concern  . Not on file   Social History Narrative  . No narrative on file    Past Surgical History  Procedure Laterality Date  . Oophorectomy      right  . Tonsillectomy    . Matuateratoma    . Tubal ligation    . Cholecystectomy  03/10/2011    Family History  Problem Relation Age of Onset  . Diabetes    . Hypertension Mother   . Heart disease Brother   . Heart disease Father   . Cancer Paternal Grandmother     breast     Allergies  Allergen Reactions  . Aspirin     REACTION: rash    No current outpatient prescriptions on file prior to visit.   No current facility-administered medications on file prior to visit.    BP 124/74  Pulse 72  Temp(Src) 98.6 F (37 C)  Resp 16  Ht 5\' 3"  (1.6 m)  Wt 152 lb (68.947 kg)  BMI 26.93 kg/m2       Objective:   Physical Exam  Nursing note and vitals reviewed. Constitutional: She is oriented to person, place, and time. She appears well-developed and well-nourished. No distress.  HENT:  Head: Normocephalic and atraumatic.  Right Ear: External ear normal.  Left Ear: External ear normal.  Nose: Nose normal.  Mouth/Throat: Oropharynx is clear and moist.  Eyes: Conjunctivae and EOM are normal. Pupils are equal, round, and reactive to light.  Neck: Normal range of motion. Neck supple. No JVD present. No tracheal deviation present. No thyromegaly present.  Cardiovascular: Normal rate, regular rhythm, normal heart sounds and intact distal pulses.   No murmur heard. Pulmonary/Chest: Effort normal and breath sounds normal. She has no wheezes. She exhibits no tenderness.  Abdominal: Soft. Bowel sounds are normal.  Musculoskeletal: Normal range of motion. She exhibits no edema and no tenderness.  Lymphadenopathy:    She has no cervical adenopathy.  Neurological: She is alert and  oriented to person, place, and time. She has normal reflexes. No cranial nerve deficit.  Skin: Skin is warm and dry. She is not diaphoretic.  Psychiatric: She has a normal mood and affect. Her behavior is normal.          Assessment & Plan:   This is a routine physical examination for this healthy  Female. Reviewed all health maintenance protocols including mammography colonoscopy bone density and reviewed appropriate screening labs. Her immunization history was reviewed as well as her current medications and allergies refills of her chronic medications were given and the plan for  yearly health maintenance was discussed all orders and referrals were made as appropriate.

## 2012-08-10 ENCOUNTER — Other Ambulatory Visit: Payer: Self-pay | Admitting: Obstetrics and Gynecology

## 2012-09-28 ENCOUNTER — Other Ambulatory Visit: Payer: Self-pay | Admitting: Dermatology

## 2013-01-18 ENCOUNTER — Telehealth: Payer: Self-pay | Admitting: Internal Medicine

## 2013-01-18 NOTE — Telephone Encounter (Signed)
Left message on machine Returning call

## 2013-01-18 NOTE — Telephone Encounter (Signed)
Pt would like bonnie to call her back concerning colonoscopy

## 2013-01-21 ENCOUNTER — Other Ambulatory Visit: Payer: Self-pay | Admitting: *Deleted

## 2013-01-21 DIAGNOSIS — K649 Unspecified hemorrhoids: Secondary | ICD-10-CM

## 2013-01-21 DIAGNOSIS — Z1211 Encounter for screening for malignant neoplasm of colon: Secondary | ICD-10-CM

## 2013-01-24 ENCOUNTER — Telehealth: Payer: Self-pay | Admitting: Internal Medicine

## 2013-01-24 NOTE — Telephone Encounter (Signed)
error 

## 2013-02-18 ENCOUNTER — Other Ambulatory Visit: Payer: Self-pay | Admitting: *Deleted

## 2013-02-18 MED ORDER — PROMETHAZINE HCL 25 MG PO TABS: 25.0000 mg | ORAL_TABLET | Freq: Three times a day (TID) | ORAL | Status: DC | PRN

## 2013-02-18 MED ORDER — CIPROFLOXACIN HCL 500 MG PO TABS: 500.0000 mg | ORAL_TABLET | Freq: Two times a day (BID) | ORAL | Status: DC

## 2013-03-22 ENCOUNTER — Encounter: Payer: Self-pay | Admitting: Gastroenterology

## 2013-03-22 ENCOUNTER — Ambulatory Visit (INDEPENDENT_AMBULATORY_CARE_PROVIDER_SITE_OTHER): Payer: BC Managed Care – PPO | Admitting: Gastroenterology

## 2013-03-22 VITALS — BP 102/60 | HR 71 | Ht 63.0 in | Wt 148.6 lb

## 2013-03-22 DIAGNOSIS — Z1211 Encounter for screening for malignant neoplasm of colon: Secondary | ICD-10-CM | POA: Insufficient documentation

## 2013-03-22 DIAGNOSIS — K648 Other hemorrhoids: Secondary | ICD-10-CM | POA: Insufficient documentation

## 2013-03-22 DIAGNOSIS — K625 Hemorrhage of anus and rectum: Secondary | ICD-10-CM | POA: Insufficient documentation

## 2013-03-22 NOTE — Assessment & Plan Note (Signed)
Limited rectal bleeding is secondary to hemorrhoids.  Recommendations #1 band ligation of hemorrhoids

## 2013-03-22 NOTE — Progress Notes (Signed)
    _                                                                                                                History of Present Illness: Pleasant 58 year old white female referred for evaluation of rectal bleeding.  For the past year she's had very frequent bright red blood per this rectum consisting of blood on the toilet tissue or mixed in the toilet water when she has a bowel movement.  In between bowel movements she frequently will stained her underclothes and has to wear a pad.  She complains of very mild rectal discomfort.  Last colonoscopy was in 2005.  There has been no change in her bowel habits.  She likely will manually reduce her hemorrhoids.    Past Medical History  Diagnosis Date  . Osteoporosis   . Hyperlipidemia   . HSV (herpes simplex virus) anogenital infection   . Headache(784.0)   . Diverticulosis   . Glaucoma   . Anemia   . Gallstones    Past Surgical History  Procedure Laterality Date  . Oophorectomy      right  . Tonsillectomy    . Matuateratoma    . Tubal ligation    . Cholecystectomy  03/10/2011   family history includes Cancer in her paternal grandmother; Diabetes in her father; Heart disease in her brother and father; Hypertension in her mother. No current outpatient prescriptions on file.   No current facility-administered medications for this visit.   Allergies as of 03/22/2013 - Review Complete 03/22/2013  Allergen Reaction Noted  . Aspirin      reports that she has quit smoking. She has never used smokeless tobacco. She reports that she drinks alcohol. She reports that she does not use illicit drugs.     Review of Systems: Pertinent positive and negative review of systems were noted in the above HPI section. All other review of systems were otherwise negative.  Vital signs were reviewed in today's medical record Physical Exam: General: Well developed , well nourished, no acute distress Skin: anicteric Head:  Normocephalic and atraumatic Eyes:  sclerae anicteric, EOMI Ears: Normal auditory acuity Mouth: No deformity or lesions Neck: Supple, no masses or thyromegaly Lungs: Clear throughout to auscultation Heart: Regular rate and rhythm; no murmurs, rubs or bruits Abdomen: Soft, non tender and non distended. No masses, hepatosplenomegaly or hernias noted. Normal Bowel sounds Rectal: There are no external abnormalities Musculoskeletal: Symmetrical with no gross deformities  Skin: No lesions on visible extremities Pulses:  Normal pulses noted Extremities: No clubbing, cyanosis, edema or deformities noted Neurological: Alert oriented x 4, grossly nonfocal Cervical Nodes:  No significant cervical adenopathy Inguinal Nodes: No significant inguinal adenopathy Psychological:  Alert and cooperative. Normal mood and affect  See Assessment and Plan under Problem List

## 2013-03-22 NOTE — Assessment & Plan Note (Signed)
Plan band ligation

## 2013-03-22 NOTE — Assessment & Plan Note (Signed)
Plan colonoscopy 

## 2013-03-22 NOTE — Patient Instructions (Addendum)
You have been scheduled for a colonoscopy with propofol. Please follow written instructions given to you at your visit today.  Please pick up your prep kit at the pharmacy within the next 1-3 days. If you use inhalers (even only as needed), please bring them with you on the day of your procedure. Your physician has requested that you go to www.startemmi.com and enter the access code given to you at your visit today. This web site gives a general overview about your procedure. However, you should still follow specific instructions given to you by our office regarding your preparation for the procedure.  Your 1st Hemorrhoidal banding is scheduled on 05/08/2013 at 8:45am  We gave you a Suprep sample kit today

## 2013-03-28 ENCOUNTER — Encounter: Payer: Self-pay | Admitting: Gastroenterology

## 2013-04-22 ENCOUNTER — Ambulatory Visit (AMBULATORY_SURGERY_CENTER): Payer: BC Managed Care – PPO | Admitting: Gastroenterology

## 2013-04-22 ENCOUNTER — Encounter: Payer: Self-pay | Admitting: Gastroenterology

## 2013-04-22 VITALS — BP 116/70 | HR 72 | Temp 98.5°F | Resp 16 | Ht 63.0 in | Wt 148.0 lb

## 2013-04-22 DIAGNOSIS — K625 Hemorrhage of anus and rectum: Secondary | ICD-10-CM

## 2013-04-22 DIAGNOSIS — D126 Benign neoplasm of colon, unspecified: Secondary | ICD-10-CM

## 2013-04-22 DIAGNOSIS — K648 Other hemorrhoids: Secondary | ICD-10-CM

## 2013-04-22 MED ORDER — SODIUM CHLORIDE 0.9 % IV SOLN: 500.0000 mL | INTRAVENOUS | Status: DC

## 2013-04-22 NOTE — Progress Notes (Signed)
A/ox3 pleased with MAC, report to Karen RN 

## 2013-04-22 NOTE — Patient Instructions (Signed)
YOU HAD AN ENDOSCOPIC PROCEDURE TODAY AT THE [facility]: Refer to the procedure report that was given to you for any specific questions about what was found during the examination.  If the procedure report does not answer your questions, please call your gastroenterologist to clarify.  If you requested that your care partner not be given the details of your procedure findings, then the procedure report has been included in a sealed envelope for you to review at your convenience later.  YOU SHOULD EXPECT: Some feelings of bloating in the abdomen. Passage of more gas than usual.  Walking can help get rid of the air that was put into your GI tract during the procedure and reduce the bloating. If you had a lower endoscopy (such as a colonoscopy or flexible sigmoidoscopy) you may notice spotting of blood in your stool or on the toilet paper. If you underwent a bowel prep for your procedure, then you may not have a normal bowel movement for a few days.  DIET: Your first meal following the procedure should be a light meal and then it is ok to progress to your normal diet.  A half-sandwich or bowl of soup is an example of a good first meal.  Heavy or fried foods are harder to digest and may make you feel nauseous or bloated.  Likewise meals heavy in dairy and vegetables can cause extra gas to form and this can also increase the bloating.  Drink plenty of fluids but you should avoid alcoholic beverages for 24 hours.  ACTIVITY: Your care partner should take you home directly after the procedure.  You should plan to take it easy, moving slowly for the rest of the day.  You can resume normal activity the day after the procedure however you should NOT DRIVE or use heavy machinery for 24 hours (because of the sedation medicines used during the test).    SYMPTOMS TO REPORT IMMEDIATELY: A gastroenterologist can be reached at any hour.  During normal business hours, 8:30 AM to 5:00 PM Monday through Friday,  call [phone].  After hours and on weekends, please call the GI answering service at [phone] who will take a message and have the physician on call contact you.   Following lower endoscopy (colonoscopy or flexible sigmoidoscopy):  Excessive amounts of blood in the stool  Significant tenderness or worsening of abdominal pains  Swelling of the abdomen that is new, acute  Fever of 100F or higher    FOLLOW UP: If any biopsies were taken you will be contacted by phone or by letter within the next 1-3 weeks.  Call your gastroenterologist if you have not heard about the biopsies in 3 weeks.  Our staff will call the home number listed on your records the next business day following your procedure to check on you and address any questions or concerns that you may have at that time regarding the information given to you following your procedure. This is a courtesy call and so if there is no answer at the home number and we have not heard from you through the emergency physician on call, we will assume that you have returned to your regular daily activities without incident.  SIGNATURES/CONFIDENTIALITY: You and/or your care partner have signed paperwork which will be entered into your electronic medical record.  These signatures attest to the fact that that the information above on your After Visit Summary has been reviewed and is understood.  Full responsibility of the confidentiality   of this discharge information lies with you and/or your care-partner.     

## 2013-04-22 NOTE — Op Note (Signed)
[place]  [address]. [place], [address]   COLONOSCOPY PROCEDURE REPORT  PATIENT: Reta, Norgren  MR#: [id number] BIRTHDATE: 1955/02/24 , 58  yrs. old GENDER: Female ENDOSCOPIST: Inda Castle, MD REFERRED XB:JYNW Vear Clock, M.D. PROCEDURE DATE:  04/22/2013 PROCEDURE:   Colonoscopy with snare polypectomy First Screening Colonoscopy - Avg.  risk and is 50 yrs.  old or older - No.  Prior Negative Screening - Now for repeat screening. 10 or more years since last screening  History of Adenoma - Now for follow-up colonoscopy & has been > or = to 3 yrs.  N/A  Polyps Removed Today? Yes. ASA CLASS:   Class II INDICATIONS:Rectal Bleeding. MEDICATIONS: MAC sedation, administered by CRNA and propofol (Diprivan) 300mg  IV  DESCRIPTION OF PROCEDURE:   After the risks benefits and alternatives of the procedure were thoroughly explained, informed consent was obtained.  A digital rectal exam revealed no abnormalities of the rectum.   The LB [id number]  endoscope was introduced through the anus and advanced to the terminal ileum which was intubated for a short distance. No adverse events experienced.   The quality of the prep was Suprep good  The instrument was then slowly withdrawn as the colon was fully examined.      COLON FINDINGS: A flat polyp measuring 8 mm in size was found at the splenic flexure.  A polypectomy was performed with a cold snare. The resection was complete and the polyp tissue was completely retrieved.   A sessile polyp was found in the sigmoid colon.  A polypectomy was performed with a cold snare.  The resection was complete and the polyp tissue was completely retrieved.   Internal hemorrhoids were found.   The mucosa appeared normal in the terminal ileum.  Retroflexed views revealed internal hemorrhoids. The time to cecum=6 minutes 35 seconds.  Withdrawal time=14 minutes 25 seconds.  The scope was withdrawn and the procedure  completed. COMPLICATIONS: There were no complications.  ENDOSCOPIC IMPRESSION: 1.   Flat polyp measuring 8 mm in size was found at the splenic flexure; polypectomy was performed with a cold snare 2.   Sessile polyp was found in the sigmoid colon; polypectomy was performed with a cold snare 3.   Internal hemorrhoids 4.   Normal mucosa in the terminal ileum  rectal bleeding secondary to hemorrhoids  RECOMMENDATIONS: 1.  If the polyp(s) removed today are proven to be adenomatous (pre-cancerous) polyps, you will need a repeat colonoscopy in 5 years.  Otherwise you should continue to follow colorectal cancer screening guidelines for "routine risk" patients with colonoscopy in 10 years.  You will receive a letter within 1-2 weeks with the results of your biopsy as well as final recommendations.  Please call my office if you have not received a letter after 3 weeks. 2.  band ligation of internal hemorrhoids   eSigned:  Inda Castle, MD 04/22/2013 4:51 PM   cc:   PATIENT NAME:  Alleyah, Twombly MR#: [id number]

## 2013-04-22 NOTE — Progress Notes (Signed)
Called to room to assist during endoscopic procedure.  Patient ID and intended procedure confirmed with present staff. Received instructions for my participation in the procedure from the performing physician.  

## 2013-04-23 ENCOUNTER — Telehealth: Payer: Self-pay

## 2013-04-23 NOTE — Telephone Encounter (Signed)
  Follow up Call-  Call back number 04/22/2013  Post procedure Call Back phone  # [phone]     Patient questions:  Do you have a fever, pain , or abdominal swelling? no Pain Score  0 *  Have you tolerated food without any problems? yes  Have you been able to return to your normal activities? yes  Do you have any questions about your discharge instructions: Diet   no Medications  no Follow up visit  no  Do you have questions or concerns about your Care? no  Actions: * If pain score is 4 or above: No action needed, pain <4.

## 2013-04-24 ENCOUNTER — Telehealth: Payer: Self-pay | Admitting: *Deleted

## 2013-04-24 NOTE — Telephone Encounter (Signed)
Patient scheduled for hemorrhoidal banding on 05/08/2013

## 2013-04-24 NOTE — Telephone Encounter (Signed)
Message copied by Oda Kilts on Wed Apr 24, 2013 10:07 AM ------      Message from: Amy Doyle      Created: Mon Apr 22, 2013  4:52 PM       Please schedule patient for band ligation of hemorrhoids ------

## 2013-04-29 ENCOUNTER — Encounter: Payer: Self-pay | Admitting: Gastroenterology

## 2013-05-08 ENCOUNTER — Encounter: Payer: Self-pay | Admitting: Gastroenterology

## 2013-05-08 ENCOUNTER — Ambulatory Visit (INDEPENDENT_AMBULATORY_CARE_PROVIDER_SITE_OTHER): Payer: BC Managed Care – PPO | Admitting: Gastroenterology

## 2013-05-08 VITALS — BP 118/70 | HR 76 | Ht 62.25 in | Wt 143.1 lb

## 2013-05-08 DIAGNOSIS — K648 Other hemorrhoids: Secondary | ICD-10-CM

## 2013-05-08 NOTE — Progress Notes (Signed)
PROCEDURE NOTE: The patient presents with symptomatic grade *3**  hemorrhoids, requesting rubber band ligation of his/her hemorrhoidal disease.  All risks, benefits and alternative forms of therapy were described and informed consent was obtained.   The anorectum was pre-medicated with lubricant and nitroglycerine ointment The decision was made to band the *right posterior** internal hemorrhoid, and the [facility] O'Regan System was used to perform band ligation without complication.  Digital anorectal examination was then performed to assure proper positioning of the band, and to adjust the banded tissue as required.  The patient was discharged home without pain or other issues.  Dietary and behavioral recommendations were given and along with follow-up instructions.    The patient will return in *2** for  follow-up and possible additional banding as required. No complications were encountered and the patient tolerated the procedure well.   

## 2013-05-08 NOTE — Patient Instructions (Signed)
HEMORRHOID BANDING PROCEDURE    FOLLOW-UP CARE   1. The procedure you have had should have been relatively painless since the banding of the area involved does not have nerve endings and there is no pain sensation.  The rubber band cuts off the blood supply to the hemorrhoid and the band may fall off as soon as 48 hours after the banding (the band may occasionally be seen in the toilet bowl following a bowel movement). You may notice a temporary feeling of fullness in the rectum which should respond adequately to plain Tylenol or Motrin.  2. Following the banding, avoid strenuous exercise that evening and resume full activity the next day.  A sitz bath (soaking in a warm tub) or bidet is soothing, and can be useful for cleansing the area after bowel movements.     3. To avoid constipation, take two tablespoons of natural wheat bran, natural oat bran, flax, Benefiber or any over the counter fiber supplement and increase your water intake to 7-8 glasses daily.    4. Unless you have been prescribed anorectal medication, do not put anything inside your rectum for two weeks: No suppositories, enemas, fingers, etc.  5. Occasionally, you may have more bleeding than usual after the banding procedure.  This is often from the untreated hemorrhoids rather than the treated one.  Don't be concerned if there is a tablespoon or so of blood.  If there is more blood than this, lie flat with your bottom higher than your head and apply an ice pack to the area. If the bleeding does not stop within a half an hour or if you feel faint, call our office at [phone] or go to the emergency room.  6. Problems are not common; however, if there is a substantial amount of bleeding, severe pain, chills, fever or difficulty passing urine (very rare) or other problems, you should call us at [phone] or report to the nearest emergency room.  7. Do not stay seated continuously for more than 2-3 hours for a day or two  after the procedure.  Tighten your buttock muscles 10-15 times every two hours and take 10-15 deep breaths every 1-2 hours.  Do not spend more than a few minutes on the toilet if you cannot empty your bowel; instead re-visit the toilet at a later time.   Your 2nd banding in scheduled on 06/05/2013 at 3:15pm

## 2013-06-05 ENCOUNTER — Ambulatory Visit (INDEPENDENT_AMBULATORY_CARE_PROVIDER_SITE_OTHER): Payer: BC Managed Care – PPO | Admitting: Gastroenterology

## 2013-06-05 ENCOUNTER — Encounter: Payer: Self-pay | Admitting: Gastroenterology

## 2013-06-05 VITALS — BP 114/80 | HR 64 | Ht 62.25 in | Wt 144.4 lb

## 2013-06-05 DIAGNOSIS — K648 Other hemorrhoids: Secondary | ICD-10-CM

## 2013-06-05 NOTE — Progress Notes (Signed)
PROCEDURE NOTE: The patient presents with symptomatic grade **3*  hemorrhoids, requesting rubber band ligation of his/her hemorrhoidal disease.  All risks, benefits and alternative forms of therapy were described and informed consent was obtained.   The anorectum was pre-medicated with lubricant and nitroglycerine ointment The decision was made to band the **right anterior* internal hemorrhoid, and the [facility] O'Regan System was used to perform band ligation without complication.  Digital anorectal examination was then performed to assure proper positioning of the band, and to adjust the banded tissue as required.  The patient was discharged home without pain or other issues.  Dietary and behavioral recommendations were given and along with follow-up instructions.    The patient will return in *2** weeks for  follow-up and possible additional banding as required. No complications were encountered and the patient tolerated the procedure well.   

## 2013-06-05 NOTE — Patient Instructions (Signed)
HEMORRHOID BANDING PROCEDURE    FOLLOW-UP CARE   1. The procedure you have had should have been relatively painless since the banding of the area involved does not have nerve endings and there is no pain sensation.  The rubber band cuts off the blood supply to the hemorrhoid and the band may fall off as soon as 48 hours after the banding (the band may occasionally be seen in the toilet bowl following a bowel movement). You may notice a temporary feeling of fullness in the rectum which should respond adequately to plain Tylenol or Motrin.  2. Following the banding, avoid strenuous exercise that evening and resume full activity the next day.  A sitz bath (soaking in a warm tub) or bidet is soothing, and can be useful for cleansing the area after bowel movements.     3. To avoid constipation, take two tablespoons of natural wheat bran, natural oat bran, flax, Benefiber or any over the counter fiber supplement and increase your water intake to 7-8 glasses daily.    4. Unless you have been prescribed anorectal medication, do not put anything inside your rectum for two weeks: No suppositories, enemas, fingers, etc.  5. Occasionally, you may have more bleeding than usual after the banding procedure.  This is often from the untreated hemorrhoids rather than the treated one.  Don't be concerned if there is a tablespoon or so of blood.  If there is more blood than this, lie flat with your bottom higher than your head and apply an ice pack to the area. If the bleeding does not stop within a half an hour or if you feel faint, call our office at [phone] or go to the emergency room.  6. Problems are not common; however, if there is a substantial amount of bleeding, severe pain, chills, fever or difficulty passing urine (very rare) or other problems, you should call us at [phone] or report to the nearest emergency room.  7. Do not stay seated continuously for more than 2-3 hours for a day or two  after the procedure.  Tighten your buttock muscles 10-15 times every two hours and take 10-15 deep breaths every 1-2 hours.  Do not spend more than a few minutes on the toilet if you cannot empty your bowel; instead re-visit the toilet at a later time.   Your 3rd banding is scheduled on 08/19/2013 at 9:15am

## 2013-07-03 ENCOUNTER — Telehealth: Payer: Self-pay | Admitting: Gastroenterology

## 2013-07-03 NOTE — Telephone Encounter (Signed)
It is up to her She could wait until other problems and then return Some people are fixed after 2

## 2013-07-03 NOTE — Telephone Encounter (Signed)
Amy Doyle pt states she has completed 2 of the 3 Hem banding appts. States she is not having anymore problems or issues at this time. Pt is thinking about cancelling the appt for the 3rd visit but wants to make sure this would be ok, doesn't want to mess anything up. Dr. Carlean Purl as doc of the day please advise.

## 2013-07-03 NOTE — Telephone Encounter (Signed)
Spoke with pt and she is aware, pt cancelled 3rd appt.

## 2013-08-19 ENCOUNTER — Encounter: Payer: BC Managed Care – PPO | Admitting: Gastroenterology

## 2013-08-29 ENCOUNTER — Other Ambulatory Visit (INDEPENDENT_AMBULATORY_CARE_PROVIDER_SITE_OTHER): Payer: BC Managed Care – PPO

## 2013-08-29 DIAGNOSIS — Z0000 Encounter for general adult medical examination without abnormal findings: Secondary | ICD-10-CM

## 2013-08-29 LAB — POCT URINALYSIS DIPSTICK
Bilirubin, UA: NEGATIVE
GLUCOSE UA: NEGATIVE
NITRITE UA: NEGATIVE
PH UA: 5
RBC UA: NEGATIVE
Spec Grav, UA: 1.02
UROBILINOGEN UA: 0.2

## 2013-08-29 LAB — CBC WITH DIFFERENTIAL/PLATELET
BASOS ABS: 0 10*3/uL (ref 0.0–0.1)
Basophils Relative: 0.3 % (ref 0.0–3.0)
Eosinophils Absolute: 0.1 10*3/uL (ref 0.0–0.7)
Eosinophils Relative: 1 % (ref 0.0–5.0)
HEMATOCRIT: 44.2 % (ref 36.0–46.0)
HEMOGLOBIN: 14.8 g/dL (ref 12.0–15.0)
LYMPHS ABS: 1.4 10*3/uL (ref 0.7–4.0)
Lymphocytes Relative: 24.2 % (ref 12.0–46.0)
MCHC: 33.4 g/dL (ref 30.0–36.0)
MCV: 103.3 fl — AB (ref 78.0–100.0)
MONOS PCT: 9.8 % (ref 3.0–12.0)
Monocytes Absolute: 0.6 10*3/uL (ref 0.1–1.0)
NEUTROS ABS: 3.7 10*3/uL (ref 1.4–7.7)
Neutrophils Relative %: 64.7 % (ref 43.0–77.0)
Platelets: 265 10*3/uL (ref 150.0–400.0)
RBC: 4.28 Mil/uL (ref 3.87–5.11)
RDW: 14 % (ref 11.5–15.5)
WBC: 5.8 10*3/uL (ref 4.0–10.5)

## 2013-08-29 LAB — BASIC METABOLIC PANEL
BUN: 16 mg/dL (ref 6–23)
CHLORIDE: 106 meq/L (ref 96–112)
CO2: 29 meq/L (ref 19–32)
Calcium: 9.8 mg/dL (ref 8.4–10.5)
Creatinine, Ser: 0.8 mg/dL (ref 0.4–1.2)
GFR: 79.92 mL/min (ref >60.00)
GLUCOSE: 88 mg/dL (ref 70–99)
POTASSIUM: 5.2 meq/L — AB (ref 3.5–5.1)
SODIUM: 143 meq/L (ref 135–145)

## 2013-08-29 LAB — LIPID PANEL
CHOL/HDL RATIO: 2
Cholesterol: 215 mg/dL — ABNORMAL HIGH (ref 0–200)
HDL: 88.7 mg/dL (ref >39.00)
LDL Cholesterol: 100 mg/dL — ABNORMAL HIGH (ref 0–99)
NONHDL: 126.3
TRIGLYCERIDES: 134 mg/dL (ref 0.0–149.0)
VLDL: 26.8 mg/dL (ref 0.0–40.0)

## 2013-08-29 LAB — HEPATIC FUNCTION PANEL
ALBUMIN: 4.1 g/dL (ref 3.5–5.2)
ALT: 36 U/L — ABNORMAL HIGH (ref 0–35)
AST: 29 U/L (ref 0–37)
Alkaline Phosphatase: 85 U/L (ref 39–117)
Bilirubin, Direct: 0 mg/dL (ref 0.0–0.3)
Total Bilirubin: 0.9 mg/dL (ref 0.2–1.2)
Total Protein: 7.4 g/dL (ref 6.0–8.3)

## 2013-08-29 LAB — TSH: TSH: 1.18 u[IU]/mL (ref 0.35–4.50)

## 2013-09-05 ENCOUNTER — Ambulatory Visit (INDEPENDENT_AMBULATORY_CARE_PROVIDER_SITE_OTHER): Payer: BC Managed Care – PPO | Admitting: Family Medicine

## 2013-09-05 ENCOUNTER — Encounter: Payer: Self-pay | Admitting: Family Medicine

## 2013-09-05 VITALS — BP 109/75 | HR 73 | Temp 97.7°F | Wt 141.0 lb

## 2013-09-05 DIAGNOSIS — Z0000 Encounter for general adult medical examination without abnormal findings: Secondary | ICD-10-CM

## 2013-09-05 DIAGNOSIS — IMO0001 Reserved for inherently not codable concepts without codable children: Secondary | ICD-10-CM

## 2013-09-05 DIAGNOSIS — Z7189 Other specified counseling: Secondary | ICD-10-CM

## 2013-09-05 DIAGNOSIS — G510 Bell's palsy: Secondary | ICD-10-CM

## 2013-09-05 DIAGNOSIS — Z7184 Encounter for health counseling related to travel: Secondary | ICD-10-CM | POA: Insufficient documentation

## 2013-09-05 DIAGNOSIS — E538 Deficiency of other specified B group vitamins: Secondary | ICD-10-CM

## 2013-09-05 DIAGNOSIS — E785 Hyperlipidemia, unspecified: Secondary | ICD-10-CM

## 2013-09-05 DIAGNOSIS — M810 Age-related osteoporosis without current pathological fracture: Secondary | ICD-10-CM

## 2013-09-05 NOTE — Assessment & Plan Note (Signed)
MCV elevated, recheck with next set of bloodwork.

## 2013-09-05 NOTE — Patient Instructions (Signed)
Things look great today. Wonderful to meet you. Cholesterol is slightly up but not in a range we need medicine. Osteoporosis-continue calcium/vit D and regular exercise.

## 2013-09-05 NOTE — Progress Notes (Signed)
Garret Reddish, MD Phone: [phone]  Subjective:  Patient presents today to establish care with me as their new primary care provider and for annual physical. Patient was formerly a patient of Dr. Arnoldo Morale. Chief complaint-noted.   Patient sees Dr. Philis Pique of ob/gyn.   Hyperlipidemia On statin: no Allergic to aspirin 10 year risk <3%.  Regular exercise:  Walk twice a day- 5 miles a day.  ROS- no chest pain or shortness of breath. No myalgias  Osteoporosis Diet/supplement/exercise controlled ROS- no bone pain or back pain  Vitamin b12 deficiency/anemia Takes supplements intermittently-just started taking again recently as had slightly worsening fatigue  The following were reviewed and entered/updated in epic: Past Medical History  Diagnosis Date  . Osteoporosis   . Hyperlipidemia   . Headache(784.0)   . Diverticulosis   . Anemia   . Gallstones    Patient Active Problem List   Diagnosis Date Noted  . VITAMIN B12 DEFICIENCY 04/24/2007    Priority: Medium  . HYPERLIPIDEMIA 01/23/2007    Priority: Medium  . OSTEOPOROSIS 01/23/2007    Priority: Medium  . Bell's palsy 09/05/2013    Priority: Low  . Travel advice encounter 09/05/2013    Priority: Low  . CHEST PAIN UNSPECIFIED 03/12/2008    Priority: Low  . TRANSAMINASES, SERUM, ELEVATED 03/12/2008    Priority: Low  . REACTIVE HYPOGLYCEMIA 04/24/2007    Priority: Low  . ANEMIA 04/17/2007    Priority: Low  . INTERNAL HEMORRHOIDS 01/23/2007    Priority: Low   Past Surgical History  Procedure Laterality Date  . Oophorectomy      right  . Tonsillectomy    . Matuateratoma    . Tubal ligation    . Cholecystectomy  03/10/2011    Family History  Problem Relation Age of Onset  . Diabetes Father   . Heart disease Father     MI 11  . Hypertension Mother   . Heart disease Brother     MI 64  . Cancer Paternal Grandmother     breast  . Colon cancer Neg Hx   . Colon polyps Neg Hx   . Rectal cancer Neg Hx   .  Stomach cancer Neg Hx     Medications- reviewed and updated No current outpatient prescriptions on file.   No current facility-administered medications for this visit.    Allergies-reviewed and updated Allergies  Allergen Reactions  . Aspirin     REACTION: rash    History   Social History  . Marital Status: Married    Spouse Name: N/A    Number of Children: N/A  . Years of Education: N/A   Social History Main Topics  . Smoking status: Former Smoker -- 1.25 packs/day for 15 years    Types: Cigarettes    Quit date: 06/03/2001  . Smokeless tobacco: Never Used  . Alcohol Use: Yes     Comment: occasional glass of wine 1x month  . Drug Use: No  . Sexual Activity: Not on file   Other Topics Concern  . Not on file   Social History Narrative   Married 8 years. 1 kid former marriage. 3 greatgrandkids.       Retired from [employer]: walking, riding bikes, reading, cooking          ROS--See HPI   Objective: BP 109/75  Pulse 73  Temp(Src) 97.7 F (36.5 C)  Wt 141 lb (63.957 kg) Gen: NAD, resting comfortably in chair HEENT: Mucous  membranes are moist. Oropharynx normal. Good dentition CV: RRR no murmurs rubs or gallops Lungs: CTAB no crackles, wheeze, rhonchi Abdomen: soft/nontender/nondistended/normal bowel sounds. No rebound or guarding.  Ext: no edema, 2+ PT and radialis pulse Skin: warm, dry, no rash or concerning skin lesions on visible skin Neuro: grossly normal, moves all extremities, PERRLA  Assessment/Plan:  58 year old female presents for annual physical.  Health Maintenance counseling: 1. Anticipatory guidance: Patient counseled regarding regular dental exams, wearing seatbelts.  2. Risk factor reduction:  Advised patient of need for regular exercise and diet rich and fruits and vegetables to reduce risk of heart attack and stroke. Patient allergic to aspirin with rash and not willing to take.  3. Immunizations/screenings/ancillary  studies: no immunizations today as patient has history of Bell's Palsy after flu vaccine ? Link-and prefers to avoid.   HYPERLIPIDEMIA Diet/exercise control. LDL 100 just above goal but patient with 10 year risk <3%, so no statin.   OSTEOPOROSIS Calcium/vita D only. Has been stable for several years per patient. She is not interested in fosamax or similar medication.   VITAMIN B12 DEFICIENCY MCV elevated, recheck with next set of bloodwork.

## 2013-09-05 NOTE — Assessment & Plan Note (Signed)
Diet/exercise control. LDL 100 just above goal but patient with 10 year risk <3%, so no statin.

## 2013-09-05 NOTE — Assessment & Plan Note (Signed)
Calcium/vita D only. Has been stable for several years per patient. She is not interested in fosamax or similar medication.

## 2013-11-12 ENCOUNTER — Other Ambulatory Visit: Payer: Self-pay | Admitting: Obstetrics and Gynecology

## 2013-11-12 LAB — HM PAP SMEAR: HM PAP: NORMAL

## 2013-11-13 ENCOUNTER — Encounter: Payer: Self-pay | Admitting: Family Medicine

## 2013-11-13 LAB — CYTOLOGY - PAP

## 2013-11-15 ENCOUNTER — Other Ambulatory Visit: Payer: Self-pay | Admitting: Obstetrics and Gynecology

## 2013-11-15 DIAGNOSIS — R928 Other abnormal and inconclusive findings on diagnostic imaging of breast: Secondary | ICD-10-CM

## 2013-12-03 ENCOUNTER — Ambulatory Visit
Admission: RE | Admit: 2013-12-03 | Discharge: 2013-12-03 | Disposition: A | Discharge status: Home / Self Care | Payer: BC Managed Care – PPO | Source: Ambulatory Visit | Attending: Obstetrics and Gynecology | Admitting: Obstetrics and Gynecology

## 2013-12-03 DIAGNOSIS — R928 Other abnormal and inconclusive findings on diagnostic imaging of breast: Secondary | ICD-10-CM

## 2014-09-03 ENCOUNTER — Other Ambulatory Visit (INDEPENDENT_AMBULATORY_CARE_PROVIDER_SITE_OTHER): Payer: BLUE CROSS/BLUE SHIELD

## 2014-09-03 DIAGNOSIS — Z0000 Encounter for general adult medical examination without abnormal findings: Secondary | ICD-10-CM

## 2014-09-03 LAB — BASIC METABOLIC PANEL
BUN: 15 mg/dL (ref 6–23)
CO2: 30 mEq/L (ref 19–32)
Calcium: 9.4 mg/dL (ref 8.4–10.5)
Chloride: 103 mEq/L (ref 96–112)
Creatinine, Ser: 0.69 mg/dL (ref 0.40–1.20)
GFR: 91.76 mL/min (ref >60.00)
GLUCOSE: 85 mg/dL (ref 70–99)
POTASSIUM: 4.3 meq/L (ref 3.5–5.1)
Sodium: 141 mEq/L (ref 135–145)

## 2014-09-03 LAB — CBC WITH DIFFERENTIAL/PLATELET
Basophils Absolute: 0 10*3/uL (ref 0.0–0.1)
Basophils Relative: 0.4 % (ref 0.0–3.0)
EOS PCT: 1.4 % (ref 0.0–5.0)
Eosinophils Absolute: 0.1 10*3/uL (ref 0.0–0.7)
HCT: 44 % (ref 36.0–46.0)
Hemoglobin: 14.7 g/dL (ref 12.0–15.0)
LYMPHS ABS: 1.1 10*3/uL (ref 0.7–4.0)
Lymphocytes Relative: 18.4 % (ref 12.0–46.0)
MCHC: 33.4 g/dL (ref 30.0–36.0)
MCV: 101.6 fl — ABNORMAL HIGH (ref 78.0–100.0)
MONO ABS: 0.6 10*3/uL (ref 0.1–1.0)
Monocytes Relative: 10.7 % (ref 3.0–12.0)
NEUTROS ABS: 4.2 10*3/uL (ref 1.4–7.7)
NEUTROS PCT: 69.1 % (ref 43.0–77.0)
PLATELETS: 265 10*3/uL (ref 150.0–400.0)
RBC: 4.33 Mil/uL (ref 3.87–5.11)
RDW: 13.8 % (ref 11.5–15.5)
WBC: 6 10*3/uL (ref 4.0–10.5)

## 2014-09-03 LAB — HEPATIC FUNCTION PANEL
ALK PHOS: 99 U/L (ref 39–117)
ALT: 31 U/L (ref 0–35)
AST: 21 U/L (ref 0–37)
Albumin: 4.2 g/dL (ref 3.5–5.2)
BILIRUBIN DIRECT: 0.1 mg/dL (ref 0.0–0.3)
Total Bilirubin: 0.5 mg/dL (ref 0.2–1.2)
Total Protein: 7.2 g/dL (ref 6.0–8.3)

## 2014-09-03 LAB — LIPID PANEL
CHOLESTEROL: 232 mg/dL — AB (ref 0–200)
HDL: 80.2 mg/dL (ref >39.00)
LDL CALC: 129 mg/dL — AB (ref 0–99)
NonHDL: 152.13
TRIGLYCERIDES: 116 mg/dL (ref 0.0–149.0)
Total CHOL/HDL Ratio: 3
VLDL: 23.2 mg/dL (ref 0.0–40.0)

## 2014-09-03 LAB — POCT URINALYSIS DIPSTICK
Bilirubin, UA: NEGATIVE
GLUCOSE UA: NEGATIVE
Ketones, UA: NEGATIVE
Leukocytes, UA: NEGATIVE
NITRITE UA: NEGATIVE
Protein, UA: NEGATIVE
RBC UA: NEGATIVE
Spec Grav, UA: 1.025
UROBILINOGEN UA: 0.2
pH, UA: 5.5

## 2014-09-03 LAB — TSH: TSH: 5.02 u[IU]/mL — ABNORMAL HIGH (ref 0.35–4.50)

## 2014-09-10 ENCOUNTER — Encounter: Payer: BC Managed Care – PPO | Admitting: Family Medicine

## 2014-09-10 ENCOUNTER — Encounter: Payer: Self-pay | Admitting: Family Medicine

## 2014-09-10 ENCOUNTER — Ambulatory Visit (INDEPENDENT_AMBULATORY_CARE_PROVIDER_SITE_OTHER): Payer: BLUE CROSS/BLUE SHIELD | Admitting: Family Medicine

## 2014-09-10 VITALS — BP 120/82 | HR 77 | Temp 97.8°F | Ht 63.0 in | Wt 153.0 lb

## 2014-09-10 DIAGNOSIS — M810 Age-related osteoporosis without current pathological fracture: Secondary | ICD-10-CM

## 2014-09-10 DIAGNOSIS — R7989 Other specified abnormal findings of blood chemistry: Secondary | ICD-10-CM | POA: Diagnosis not present

## 2014-09-10 DIAGNOSIS — D7589 Other specified diseases of blood and blood-forming organs: Secondary | ICD-10-CM | POA: Diagnosis not present

## 2014-09-10 DIAGNOSIS — E785 Hyperlipidemia, unspecified: Secondary | ICD-10-CM

## 2014-09-10 DIAGNOSIS — Z0000 Encounter for general adult medical examination without abnormal findings: Secondary | ICD-10-CM | POA: Diagnosis not present

## 2014-09-10 LAB — TSH: TSH: 4.08 u[IU]/mL (ref 0.35–4.50)

## 2014-09-10 LAB — VITAMIN B12: VITAMIN B 12: 745 pg/mL (ref 211–911)

## 2014-09-10 LAB — T4, FREE: FREE T4: 0.8 ng/dL (ref 0.60–1.60)

## 2014-09-10 NOTE — Progress Notes (Signed)
Amy Reddish, MD Phone: [phone]  Subjective:  Patient presents today for their annual physical. Chief complaint-noted.   Patient doing well. Exercisign regularly. Weight up 12 lbs though. We discussed increasing exercise and watching diet a little closer. Consider nutritionist eval.   ROS- full  review of systems was completed and negative   The following were reviewed and entered/updated in epic: Past Medical History  Diagnosis Date  . Osteoporosis   . Hyperlipidemia   . Headache(784.0)   . Diverticulosis   . Anemia   . Gallstones    Patient Active Problem List   Diagnosis Date Noted  . Vitamin B12 deficiency 04/24/2007    Priority: Medium  . Hyperlipidemia 01/23/2007    Priority: Medium  . Osteoporosis 01/23/2007    Priority: Medium  . Bell's palsy 09/05/2013    Priority: Low  . Travel advice encounter 09/05/2013    Priority: Low  . CHEST PAIN UNSPECIFIED 03/12/2008    Priority: Low  . REACTIVE HYPOGLYCEMIA 04/24/2007    Priority: Low  . ANEMIA 04/17/2007    Priority: Low  . INTERNAL HEMORRHOIDS 01/23/2007    Priority: Low   Past Surgical History  Procedure Laterality Date  . Oophorectomy      right  . Tonsillectomy    . Matuateratoma    . Tubal ligation    . Cholecystectomy  03/10/2011    Family History  Problem Relation Age of Onset  . Diabetes Father   . Heart disease Father     MI 3  . Hypertension Mother   . Heart disease Brother     MI 83  . Cancer Paternal Grandmother     breast  . Colon cancer Neg Hx   . Colon polyps Neg Hx   . Rectal cancer Neg Hx   . Stomach cancer Neg Hx     Medications- reviewed and updated. B12 supplement 1000mg  daily.   Allergies-reviewed and updated Allergies  Allergen Reactions  . Aspirin     REACTION: rash    Social History   Social History  . Marital Status: Married    Spouse Name: N/A  . Number of Children: N/A  . Years of Education: N/A   Social History Main Topics  . Smoking status:  Former Smoker -- 1.25 packs/day for 15 years    Types: Cigarettes    Quit date: 06/03/2001  . Smokeless tobacco: Never Used  . Alcohol Use: Yes     Comment: occasional glass of wine 1x month  . Drug Use: No  . Sexual Activity: Not Asked   Other Topics Concern  . None   Social History Narrative   Married 8 years. 1 kid former marriage. 3 greatgrandkids.       Retired from [employer]: walking, riding bikes, reading, cooking         Objective: BP 120/82 mmHg  Pulse 77  Temp(Src) 97.8 F (36.6 C)  Ht 5\' 3"  (1.6 m)  Wt 153 lb (69.4 kg)  BMI 27.11 kg/m2 Gen: NAD, resting comfortably No thyromegaly Declines breast and gyn exam- going to see GYN later this year HEENT: Mucous membranes are moist. Oropharynx normal Neck: no thyromegaly CV: RRR no murmurs rubs or gallops Lungs: CTAB no crackles, wheeze, rhonchi Abdomen: soft/nontender/nondistended/normal bowel sounds. No rebound or guarding.  Ext: no edema Skin: warm, dry Neuro: grossly normal, moves all extremities, PERRLA   Assessment/Plan:  59 y.o. female presenting for annual physical.  Health Maintenance counseling: 1. Anticipatory  guidance: Patient counseled regarding regular dental exams, wearing seatbelts, wearing sunscreen, annual eye doctor visit.  2. Risk factor reduction:  Advised patient of need for regular exercise and diet rich and fruits and vegetables to reduce risk of heart attack and stroke. 3x a week exercise. About to start swim lessons.  3. Immunizations/screenings/ancillary studies Health Maintenance Due  Topic Date Due  . Hepatitis C Screening  1955-08-02  . HIV Screening  02/11/1968  . TETANUS/TDAP - no immunizations today as patient has history of Bell's Palsy after flu vaccine ? Link-and prefers to avoid.  03/27/2013  4. Cervical cancer screening- 11/22/13 with 3 year follow up required. History some time ago of abnormal so getting yearly.  5. Breast cancer screening-  breast exam with  Dr. Philis Pique and mammogram 12/03/13- had to have ultrasound for benign cysts 6. Colon cancer screening - 04/22/13, 10 year follow up 7. Skin cancer screening- sees Dr. Ubaldo Glassing as needed   Lab Results  Component Value Date   CHOL 232* 09/03/2014   HDL 80.20 09/03/2014   LDLCALC 129* 09/03/2014   LDLDIRECT 104.1 02/27/2012   TRIG 116.0 09/03/2014   CHOLHDL 3 09/03/2014  4.4% 10 year risk  B12,folate- b12 normal, folate pending TSH, T4- normal    Vitamin B12 deficiency S: previously controlled, taking oral supplements A/P: b12 adequate today, has persistent macrocytosis for years and years without worsening of any symptoms. Continue oral replacement   Osteoporosis S: walking 3x a week. 3 years ago was last scan. Not taking vitamin D or calcium A/P: patient concerned about calcium- advised to look for dietary supplements instead of in pill form. Start 800 IU vit D. Would be candidate for bisphosphonate- not interested currently. If worsening on next DEXA, need to strongly consider   Hyperlipidemia S: based on labs 4.4% 10 year risk  In 2016. No rx.  A/P: continue with healthy habits. No statin unless risk >7.5% for primary prevention   1 year CPE  Results for orders placed or performed in visit on 09/10/14 (from the past 24 hour(s))  Vitamin B12     Status: None   Collection Time: 09/10/14  9:12 AM  Result Value Ref Range   Vitamin B-12 745 211 - 911 pg/mL  TSH     Status: None   Collection Time: 09/10/14  9:12 AM  Result Value Ref Range   TSH 4.08 0.35 - 4.50 uIU/mL  T4, free     Status: None   Collection Time: 09/10/14  9:12 AM  Result Value Ref Range   Free T4 0.80 0.60 - 1.60 ng/dL

## 2014-09-10 NOTE — Patient Instructions (Signed)
Let's bump the exercise and try to get the weight down at least 5 lbs. We could consider nutritionist if not able to make progress by next year to see if there are some hidden trouble spots.   Labs before you go- check on b12, folate and recheck thyroid. Possible thyroid causing weight gain. Would not start you on meds unless TSH is high and T4 is low, otherwise would plan on 3 month repeat  See you in a year unless you need [place] sooner  Health Maintenance Due  Topic Date Due  . TETANUS/TDAP  03/27/2013  Declined today

## 2014-09-10 NOTE — Assessment & Plan Note (Signed)
S: walking 3x a week. 3 years ago was last scan. Not taking vitamin D or calcium A/P: patient concerned about calcium- advised to look for dietary supplements instead of in pill form. Start 800 IU vit D. Would be candidate for bisphosphonate- not interested currently. If worsening on next DEXA, need to strongly consider

## 2014-09-10 NOTE — Assessment & Plan Note (Signed)
S: previously controlled, taking oral supplements A/P: b12 adequate today, has persistent macrocytosis for years and years without worsening of any symptoms. Continue oral replacement

## 2014-09-10 NOTE — Assessment & Plan Note (Signed)
S: based on labs 4.4% 10 year risk  In 2016. No rx.  A/P: continue with healthy habits. No statin unless risk >7.5% for primary prevention

## 2014-09-11 LAB — FOLATE RBC: RBC Folate: 659 ng/mL (ref >280)

## 2014-09-13 ENCOUNTER — Encounter: Payer: Self-pay | Admitting: Family Medicine

## 2014-11-17 ENCOUNTER — Other Ambulatory Visit: Payer: Self-pay

## 2014-11-17 DIAGNOSIS — Z1231 Encounter for screening mammogram for malignant neoplasm of breast: Secondary | ICD-10-CM

## 2014-12-23 ENCOUNTER — Other Ambulatory Visit: Payer: Self-pay | Admitting: Obstetrics and Gynecology

## 2014-12-24 ENCOUNTER — Other Ambulatory Visit: Payer: Self-pay

## 2014-12-24 ENCOUNTER — Ambulatory Visit
Admission: RE | Admit: 2014-12-24 | Discharge: 2014-12-24 | Disposition: A | Discharge status: Home / Self Care | Payer: BLUE CROSS/BLUE SHIELD | Source: Ambulatory Visit

## 2014-12-24 DIAGNOSIS — Z1231 Encounter for screening mammogram for malignant neoplasm of breast: Secondary | ICD-10-CM

## 2014-12-24 LAB — CYTOLOGY - PAP

## 2015-05-08 ENCOUNTER — Ambulatory Visit (INDEPENDENT_AMBULATORY_CARE_PROVIDER_SITE_OTHER): Payer: BLUE CROSS/BLUE SHIELD | Admitting: Adult Health

## 2015-05-08 ENCOUNTER — Encounter: Payer: Self-pay | Admitting: Adult Health

## 2015-05-08 VITALS — BP 152/90 | HR 81 | Temp 97.8°F | Wt 152.5 lb

## 2015-05-08 DIAGNOSIS — R591 Generalized enlarged lymph nodes: Secondary | ICD-10-CM

## 2015-05-08 DIAGNOSIS — R599 Enlarged lymph nodes, unspecified: Secondary | ICD-10-CM

## 2015-05-08 NOTE — Progress Notes (Signed)
Subjective:    Patient ID: Amy Doyle, female    DOB: [birth year], 60 y.o.   MRN: [id number]  HPI  60 year old female who presents to the office today for " a knot on the right side of my face". She was getting her face waxed when the wax specialist noticed that " I had a lump on the side of my face." She had not noticed it before today. Has not been sick or had fevers.     Review of Systems  Constitutional: Negative.   Hematological: Positive for adenopathy.  All other systems reviewed and are negative.  Past Medical History  Diagnosis Date  . Osteoporosis   . Hyperlipidemia   . Headache(784.0)   . Diverticulosis   . Anemia   . Gallstones     Social History   Social History  . Marital Status: Married    Spouse Name: N/A  . Number of Children: N/A  . Years of Education: N/A   Occupational History  . Not on file.   Social History Main Topics  . Smoking status: Former Smoker -- 1.25 packs/day for 15 years    Types: Cigarettes    Quit date: 06/03/2001  . Smokeless tobacco: Never Used  . Alcohol Use: Yes     Comment: occasional glass of wine 1x month  . Drug Use: No  . Sexual Activity: Not on file   Other Topics Concern  . Not on file   Social History Narrative   Married 8 years. 1 kid former marriage. 3 greatgrandkids.       Retired from [employer]: walking, riding bikes, reading, cooking          Past Surgical History  Procedure Laterality Date  . Oophorectomy      right  . Tonsillectomy    . Matuateratoma    . Tubal ligation    . Cholecystectomy  03/10/2011    Family History  Problem Relation Age of Onset  . Diabetes Father   . Heart disease Father     MI 43  . Hypertension Mother   . Heart disease Brother     MI 87  . Cancer Paternal Grandmother     breast  . Colon cancer Neg Hx   . Colon polyps Neg Hx   . Rectal cancer Neg Hx   . Stomach cancer Neg Hx     Allergies  Allergen Reactions  . Aspirin     REACTION: rash      No current outpatient prescriptions on file prior to visit.   No current facility-administered medications on file prior to visit.    BP 152/90 mmHg  Pulse 81  Temp(Src) 97.8 F (36.6 C) (Oral)  Wt 152 lb 8 oz (69.174 kg)  SpO2 98%       Objective:   Physical Exam  Constitutional: She is oriented to person, place, and time. She appears well-developed and well-nourished. No distress.  Lymphadenopathy:       Head (right side): Submandibular adenopathy present.       Head (left side): Submandibular adenopathy present.  Neurological: She is alert and oriented to person, place, and time.  Skin: Skin is warm and dry. No rash noted. She is not diaphoretic. No erythema. No pallor.  Psychiatric: She has a normal mood and affect. Her behavior is normal. Judgment and thought content normal.  Nursing note and vitals reviewed.     Assessment & Plan:  1.  Swollen lymph nodes Differential include: viral or bacterial infection. Doubt ALL, SLE, or lymphoma - Follow up with Dr. Yong Channel if no improvement that in the next 4-5 days.   Dorothyann Peng, NP

## 2015-05-08 NOTE — Patient Instructions (Signed)
It was great meeting you.   It feels as though you have swollen lymph nodes.   If you continue to have swollen lymph nodes next week, follow up with Dr. Yong Channel.    Have a great mini vacation!

## 2015-05-14 ENCOUNTER — Ambulatory Visit (INDEPENDENT_AMBULATORY_CARE_PROVIDER_SITE_OTHER): Payer: BLUE CROSS/BLUE SHIELD | Admitting: Family Medicine

## 2015-05-14 ENCOUNTER — Encounter: Payer: Self-pay | Admitting: Family Medicine

## 2015-05-14 VITALS — BP 122/70 | HR 80 | Temp 98.3°F | Wt 153.0 lb

## 2015-05-14 DIAGNOSIS — R599 Enlarged lymph nodes, unspecified: Secondary | ICD-10-CM

## 2015-05-14 DIAGNOSIS — R590 Localized enlarged lymph nodes: Secondary | ICD-10-CM | POA: Diagnosis not present

## 2015-05-14 NOTE — Patient Instructions (Signed)
Labs before you leave  This is a lymph node that is enlarged- no obvious infection as cause but sometimes its not an apparent infection that our bodies are fighting off  If this doesn't resolve within 4 weeks, return to see me. We are going to make sure labs look ok today

## 2015-05-14 NOTE — Progress Notes (Signed)
Subjective:  Amy Doyle is a 60 y.o. year old very pleasant female patient who presents for/with See problem oriented charting ROS- no fever, chills, nausea, vomiting, fatigue, unintentional weight loss. No recent illness. .see any ROS included in HPI as well.   Past Medical History-  Patient Active Problem List   Diagnosis Date Noted  . Vitamin B12 deficiency 04/24/2007    Priority: Medium  . Hyperlipidemia 01/23/2007    Priority: Medium  . Osteoporosis 01/23/2007    Priority: Medium  . Bell's palsy 09/05/2013    Priority: Low  . Travel advice encounter 09/05/2013    Priority: Low  . CHEST PAIN UNSPECIFIED 03/12/2008    Priority: Low  . REACTIVE HYPOGLYCEMIA 04/24/2007    Priority: Low  . ANEMIA 04/17/2007    Priority: Low  . INTERNAL HEMORRHOIDS 01/23/2007    Priority: Low    Medications- reviewed and updated No current outpatient prescriptions on file.   No current facility-administered medications for this visit.    Objective: BP 122/70 mmHg  Pulse 80  Temp(Src) 98.3 F (36.8 C)  Wt 153 lb (69.4 kg) Gen: NAD, resting comfortably Singular 1 x 1 cm lymph node noted submental on right. No other lymphadenopathy noted from waist up.  Nares normal, oropharynx normal, no cuts or wounds or source of infection CV: RRR no murmurs rubs or gallops Lungs: CTAB no crackles, wheeze, rhonchi Abdomen: soft/nontender/nondistended/normal bowel sounds. No rebound or guarding.  Ext: no edema Skin: warm, dry, no rash above lymph node Neuro: grossly normal, moves all extremities  Assessment/Plan:  Submental lymphadenopathy and Cervical lymphadenopathy S: noted by person doing her facial wax - bump on the ride side of her face under her chin. She thinks she also had a cervical lymph node on left enlarged last week when seen in office which shedoes not note at this point. She has no pain or irritation. Has not been sick recently. Feels very well overall. No skin lesoins or entry  point for infection noted A/P: we opted to check labs today given now over a week and not improving though suspect may resolve within 3-4 weeks- suspect this is simply submental lymphadenopathy with reaction to foreign substance or illness. Will get labs to evaluate for any cell line changes. Discussed if this persists more than 4 weeks or worsens or new symptoms- reevaluate- would consider ENT likely at that point.   - Plan: CBC with Differential, Comprehensive metabolic panel  Return precautions advised.   Orders Placed This Encounter  Procedures  . CBC with Differential  . Comprehensive metabolic panel    [place]   Garret Reddish, MD

## 2015-05-15 LAB — CBC WITH DIFFERENTIAL/PLATELET
BASOS ABS: 0 10*3/uL (ref 0.0–0.1)
BASOS PCT: 0.6 % (ref 0.0–3.0)
Eosinophils Absolute: 0.1 10*3/uL (ref 0.0–0.7)
Eosinophils Relative: 1.1 % (ref 0.0–5.0)
HEMATOCRIT: 43.2 % (ref 36.0–46.0)
HEMOGLOBIN: 14.5 g/dL (ref 12.0–15.0)
LYMPHS PCT: 22.4 % (ref 12.0–46.0)
Lymphs Abs: 1.6 10*3/uL (ref 0.7–4.0)
MCHC: 33.6 g/dL (ref 30.0–36.0)
MCV: 101 fl — AB (ref 78.0–100.0)
MONOS PCT: 9.6 % (ref 3.0–12.0)
Monocytes Absolute: 0.7 10*3/uL (ref 0.1–1.0)
NEUTROS ABS: 4.8 10*3/uL (ref 1.4–7.7)
Neutrophils Relative %: 66.3 % (ref 43.0–77.0)
PLATELETS: 270 10*3/uL (ref 150.0–400.0)
RBC: 4.28 Mil/uL (ref 3.87–5.11)
RDW: 13.9 % (ref 11.5–15.5)
WBC: 7.2 10*3/uL (ref 4.0–10.5)

## 2015-05-15 LAB — COMPREHENSIVE METABOLIC PANEL
ALBUMIN: 4.3 g/dL (ref 3.5–5.2)
ALK PHOS: 91 U/L (ref 39–117)
ALT: 31 U/L (ref 0–35)
AST: 22 U/L (ref 0–37)
BILIRUBIN TOTAL: 0.3 mg/dL (ref 0.2–1.2)
BUN: 17 mg/dL (ref 6–23)
CALCIUM: 9.7 mg/dL (ref 8.4–10.5)
CO2: 28 meq/L (ref 19–32)
CREATININE: 0.76 mg/dL (ref 0.40–1.20)
Chloride: 104 mEq/L (ref 96–112)
GFR: 81.89 mL/min (ref >60.00)
Glucose, Bld: 89 mg/dL (ref 70–99)
Potassium: 3.9 mEq/L (ref 3.5–5.1)
Sodium: 141 mEq/L (ref 135–145)
Total Protein: 7.1 g/dL (ref 6.0–8.3)

## 2015-05-21 ENCOUNTER — Ambulatory Visit: Payer: BLUE CROSS/BLUE SHIELD | Admitting: Family Medicine

## 2015-06-09 ENCOUNTER — Encounter: Payer: Self-pay | Admitting: Family Medicine

## 2015-06-09 ENCOUNTER — Ambulatory Visit (INDEPENDENT_AMBULATORY_CARE_PROVIDER_SITE_OTHER): Payer: BLUE CROSS/BLUE SHIELD | Admitting: Family Medicine

## 2015-06-09 VITALS — BP 122/68 | HR 76 | Temp 98.0°F | Ht 63.0 in | Wt 152.0 lb

## 2015-06-09 DIAGNOSIS — R590 Localized enlarged lymph nodes: Secondary | ICD-10-CM

## 2015-06-09 NOTE — Patient Instructions (Addendum)
We will call you within a week about your referral to ENT with Dr. Redmond Baseman. If you do not hear within 2 weeks, give [place] a call.

## 2015-06-09 NOTE — Progress Notes (Signed)
Subjective:  Amy Doyle is a 60 y.o. year old very pleasant female patient who presents for/with See problem oriented charting ROS- no fever, chills, nausea, vomiting, fatigue, unintentional weight loss, night sweats. No recent illness. see any ROS included in HPI as well.   Past Medical History-  Patient Active Problem List   Diagnosis Date Noted  . Vitamin B12 deficiency 04/24/2007    Priority: Medium  . Hyperlipidemia 01/23/2007    Priority: Medium  . Osteoporosis 01/23/2007    Priority: Medium  . Bell's palsy 09/05/2013    Priority: Low  . Travel advice encounter 09/05/2013    Priority: Low  . CHEST PAIN UNSPECIFIED 03/12/2008    Priority: Low  . REACTIVE HYPOGLYCEMIA 04/24/2007    Priority: Low  . ANEMIA 04/17/2007    Priority: Low  . INTERNAL HEMORRHOIDS 01/23/2007    Priority: Low    Medications- reviewed and updated, no medications  Objective: BP 122/68 mmHg  Pulse 76  Temp(Src) 98 F (36.7 C) (Oral)  Ht 5\' 3"  (1.6 m)  Wt 152 lb (68.947 kg)  BMI 26.93 kg/m2 Gen: NAD, resting comfortably Singular 1 x 1 cm lymph node noted submandibular on right. No other lymphadenopathy noted from waist up.  Nares normal, oropharynx normal, no cuts or wounds or source of infection CV: RRR no murmurs rubs or gallops Lungs: CTAB no crackles, wheeze, rhonchi Ext: no edema Skin: warm, dry, no rash around lymph node  Assessment/Plan:  Submandibular  lymphadenopathy (presumed) S: noted by person doing her facial wax - bump on the ride side of her face under her chin in very early May. No other lymph node enlargement noted. She has no pain or irritation.She had not been ill recently.  I saw her about 3-4 weeks ago and we discussed plan of referring to ENT for consideration or ultrasound or biopsy if not resolved within a month. We did a CBC and CMP at last visit and no abnormalities found except stable macrocytosis  The only change she has noted is that it seems to be firmer at  this point. Same size at about 1 cm x 1 cm.  A/P: persistent presumed lymphadenopathy. Think this is too far forward to be related to submandibular gland. Given over a month, refer to ENT at this point- have requested Dr. Redmond Baseman. Did not think repeat labs would change decision making.   Return precautions advised.   Orders Placed This Encounter  Procedures  . Ambulatory referral to ENT    Referral Priority:  Routine    Referral Type:  Consultation    Referral Reason:  Specialty Services Required    Requested Specialty:  Otolaryngology    Number of Visits Requested:  1   The duration of face-to-face time during this visit was 15 minutes. Greater than 50% of this time was spent in counseling, explanation of diagnosis, planning of further management, and/or coordination of care.    Garret Reddish, MD

## 2015-07-03 DIAGNOSIS — R221 Localized swelling, mass and lump, neck: Secondary | ICD-10-CM | POA: Diagnosis not present

## 2015-07-03 DIAGNOSIS — J343 Hypertrophy of nasal turbinates: Secondary | ICD-10-CM | POA: Diagnosis not present

## 2015-07-11 ENCOUNTER — Other Ambulatory Visit: Payer: Self-pay | Admitting: Otolaryngology

## 2015-07-11 DIAGNOSIS — R220 Localized swelling, mass and lump, head: Secondary | ICD-10-CM

## 2015-07-11 DIAGNOSIS — R221 Localized swelling, mass and lump, neck: Principal | ICD-10-CM

## 2015-07-17 ENCOUNTER — Ambulatory Visit
Admission: RE | Admit: 2015-07-17 | Discharge: 2015-07-17 | Disposition: A | Discharge status: Home / Self Care | Payer: BLUE CROSS/BLUE SHIELD | Source: Ambulatory Visit | Attending: Otolaryngology | Admitting: Otolaryngology

## 2015-07-17 DIAGNOSIS — R221 Localized swelling, mass and lump, neck: Principal | ICD-10-CM

## 2015-07-17 DIAGNOSIS — R220 Localized swelling, mass and lump, head: Secondary | ICD-10-CM

## 2015-07-17 MED ORDER — IOPAMIDOL (ISOVUE-300) INJECTION 61%
75.0000 mL | Freq: Once | INTRAVENOUS | Status: AC | PRN
  Administered 2015-07-17: 75 mL via INTRAVENOUS

## 2015-07-27 ENCOUNTER — Telehealth: Payer: Self-pay | Admitting: Family Medicine

## 2015-07-27 NOTE — Telephone Encounter (Signed)
Pt does not want to make an appointment due to copay. Pt would like dr hunter to call her concerning upcoming surgery

## 2015-07-28 NOTE — Telephone Encounter (Signed)
Please see message. °

## 2015-07-28 NOTE — Telephone Encounter (Signed)
LVM to call back and informed only here in AMs and may be leaving office soon today. Gone next week  Make sure she has right # listed to call back as did not pick up.

## 2015-07-29 NOTE — Telephone Encounter (Signed)
Pt is return dr Retail banker call. Please call pt at [phone]

## 2015-07-29 NOTE — Telephone Encounter (Signed)
Discussed case. She will move forward with surgery per Dr. Kandace Blitz even though FNA was benign

## 2015-08-17 ENCOUNTER — Other Ambulatory Visit (HOSPITAL_COMMUNITY)
Admission: RE | Admit: 2015-08-17 | Discharge: 2015-08-17 | Disposition: A | Discharge status: Home / Self Care | Payer: BLUE CROSS/BLUE SHIELD | Source: Ambulatory Visit | Attending: Otolaryngology | Admitting: Otolaryngology

## 2015-08-17 ENCOUNTER — Other Ambulatory Visit: Payer: Self-pay | Admitting: Otolaryngology

## 2015-08-17 DIAGNOSIS — R599 Enlarged lymph nodes, unspecified: Secondary | ICD-10-CM | POA: Diagnosis not present

## 2015-08-17 DIAGNOSIS — R220 Localized swelling, mass and lump, head: Secondary | ICD-10-CM | POA: Diagnosis not present

## 2015-08-17 DIAGNOSIS — R591 Generalized enlarged lymph nodes: Secondary | ICD-10-CM | POA: Diagnosis not present

## 2015-09-03 ENCOUNTER — Other Ambulatory Visit: Payer: BLUE CROSS/BLUE SHIELD

## 2015-09-10 ENCOUNTER — Encounter: Payer: BLUE CROSS/BLUE SHIELD | Admitting: Family Medicine

## 2015-09-10 ENCOUNTER — Other Ambulatory Visit (INDEPENDENT_AMBULATORY_CARE_PROVIDER_SITE_OTHER): Payer: BLUE CROSS/BLUE SHIELD

## 2015-09-10 DIAGNOSIS — Z0000 Encounter for general adult medical examination without abnormal findings: Secondary | ICD-10-CM | POA: Diagnosis not present

## 2015-09-10 LAB — BASIC METABOLIC PANEL
BUN: 14 mg/dL (ref 6–23)
CALCIUM: 8.9 mg/dL (ref 8.4–10.5)
CO2: 29 mEq/L (ref 19–32)
CREATININE: 0.72 mg/dL (ref 0.40–1.20)
Chloride: 107 mEq/L (ref 96–112)
GFR: 87.07 mL/min (ref >60.00)
GLUCOSE: 88 mg/dL (ref 70–99)
POTASSIUM: 4.3 meq/L (ref 3.5–5.1)
Sodium: 140 mEq/L (ref 135–145)

## 2015-09-10 LAB — CBC WITH DIFFERENTIAL/PLATELET
BASOS ABS: 0 10*3/uL (ref 0.0–0.1)
Basophils Relative: 0.5 % (ref 0.0–3.0)
EOS ABS: 0 10*3/uL (ref 0.0–0.7)
Eosinophils Relative: 0.7 % (ref 0.0–5.0)
HCT: 42.6 % (ref 36.0–46.0)
HEMOGLOBIN: 14.7 g/dL (ref 12.0–15.0)
LYMPHS ABS: 1.3 10*3/uL (ref 0.7–4.0)
Lymphocytes Relative: 22.2 % (ref 12.0–46.0)
MCHC: 34.5 g/dL (ref 30.0–36.0)
MCV: 100 fl (ref 78.0–100.0)
MONO ABS: 0.6 10*3/uL (ref 0.1–1.0)
Monocytes Relative: 10.7 % (ref 3.0–12.0)
NEUTROS PCT: 65.9 % (ref 43.0–77.0)
Neutro Abs: 3.8 10*3/uL (ref 1.4–7.7)
Platelets: 291 10*3/uL (ref 150.0–400.0)
RBC: 4.26 Mil/uL (ref 3.87–5.11)
RDW: 14 % (ref 11.5–15.5)
WBC: 5.8 10*3/uL (ref 4.0–10.5)

## 2015-09-10 LAB — POC URINALSYSI DIPSTICK (AUTOMATED)
BILIRUBIN UA: NEGATIVE
Blood, UA: NEGATIVE
Glucose, UA: NEGATIVE
KETONES UA: NEGATIVE
LEUKOCYTES UA: NEGATIVE
Nitrite, UA: NEGATIVE
PH UA: 5
Protein, UA: NEGATIVE
SPEC GRAV UA: 1.025
Urobilinogen, UA: 0.2

## 2015-09-10 LAB — HEPATIC FUNCTION PANEL
ALT: 27 U/L (ref 0–35)
AST: 22 U/L (ref 0–37)
Albumin: 4.1 g/dL (ref 3.5–5.2)
Alkaline Phosphatase: 88 U/L (ref 39–117)
BILIRUBIN DIRECT: 0.1 mg/dL (ref 0.0–0.3)
BILIRUBIN TOTAL: 0.5 mg/dL (ref 0.2–1.2)
Total Protein: 7 g/dL (ref 6.0–8.3)

## 2015-09-10 LAB — LIPID PANEL
CHOL/HDL RATIO: 3
CHOLESTEROL: 239 mg/dL — AB (ref 0–200)
HDL: 85.8 mg/dL (ref >39.00)
LDL CALC: 136 mg/dL — AB (ref 0–99)
NonHDL: 152.77
Triglycerides: 85 mg/dL (ref 0.0–149.0)
VLDL: 17 mg/dL (ref 0.0–40.0)

## 2015-09-10 LAB — TSH: TSH: 2.93 u[IU]/mL (ref 0.35–4.50)

## 2015-09-15 ENCOUNTER — Ambulatory Visit (INDEPENDENT_AMBULATORY_CARE_PROVIDER_SITE_OTHER): Payer: BLUE CROSS/BLUE SHIELD | Admitting: Family Medicine

## 2015-09-15 ENCOUNTER — Encounter: Payer: Self-pay | Admitting: Family Medicine

## 2015-09-15 VITALS — BP 128/88 | HR 79 | Temp 97.7°F | Ht 62.5 in | Wt 152.4 lb

## 2015-09-15 DIAGNOSIS — E785 Hyperlipidemia, unspecified: Secondary | ICD-10-CM | POA: Diagnosis not present

## 2015-09-15 DIAGNOSIS — M810 Age-related osteoporosis without current pathological fracture: Secondary | ICD-10-CM

## 2015-09-15 DIAGNOSIS — Z0000 Encounter for general adult medical examination without abnormal findings: Secondary | ICD-10-CM

## 2015-09-15 MED ORDER — TETANUS-DIPHTH-ACELL PERTUSSIS 5-2.5-18.5 LF-MCG/0.5 IM SUSP: 0.5000 mL | Freq: Once | INTRAMUSCULAR | 0 refills | Status: DC

## 2015-09-15 NOTE — Assessment & Plan Note (Signed)
3.5% risk 2017, great HDL- remain off statin

## 2015-09-15 NOTE — Progress Notes (Signed)
Phone: [phone]  Subjective:  Patient presents today for their annual physical. Chief complaint-noted.   See problem oriented charting- ROS- full  review of systems was completed and negative including No chest pain or shortness of breath. No headache or blurry vision.   The following were reviewed and entered/updated in epic: Past Medical History:  Diagnosis Date  . Anemia   . Diverticulosis   . Gallstones   . Headache(784.0)   . Hyperlipidemia   . Osteoporosis    Patient Active Problem List   Diagnosis Date Noted  . Vitamin B12 deficiency 04/24/2007    Priority: Medium  . Hyperlipidemia 01/23/2007    Priority: Medium  . Osteoporosis 01/23/2007    Priority: Medium  . Bell's palsy 09/05/2013    Priority: Low  . Travel advice encounter 09/05/2013    Priority: Low  . CHEST PAIN UNSPECIFIED 03/12/2008    Priority: Low  . REACTIVE HYPOGLYCEMIA 04/24/2007    Priority: Low  . ANEMIA 04/17/2007    Priority: Low  . INTERNAL HEMORRHOIDS 01/23/2007    Priority: Low   Past Surgical History:  Procedure Laterality Date  . CHOLECYSTECTOMY  03/10/2011  . matuateratoma    . OOPHORECTOMY     right  . TONSILLECTOMY    . TUBAL LIGATION      Family History  Problem Relation Age of Onset  . Diabetes Father   . Heart disease Father     MI [id number]  . Hypertension Mother   . Heart disease Brother     MI 3457  . Cancer Paternal Grandmother     breast  . Colon cancer Neg Hx   . Colon polyps Neg Hx   . Rectal cancer Neg Hx   . Stomach cancer Neg Hx    Medications- reviewed and updated, none  Allergies-reviewed and updated Allergies  Allergen Reactions  . Aspirin     REACTION: rash    Social History   Social History  . Marital status: Married    Spouse name: N/A  . Number of children: N/A  . Years of education: N/A   Social History Main Topics  . Smoking status: Former Smoker    Packs/day: 1.25    Years: 15.00    Types: Cigarettes    Quit date: 06/03/2001  .  Smokeless tobacco: Never Used  . Alcohol use Yes     Comment: occasional glass of wine 1x month  . Drug use: No  . Sexual activity: Not Asked   Other Topics Concern  . None   Social History Narrative   Married 8 years. 1 kid former marriage. 3 greatgrandkids.       Retired from [employer]      Hobbies: walking, riding bikes, reading, cooking          Objective: BP 128/88 (BP Location: Left Arm, Patient Position: Sitting, Cuff Size: Normal)   Pulse 79   Temp 97.7 F (36.5 C) (Oral)   Ht 5' 2.5" (1.588 m)   Wt 152 lb 6.4 oz (69.1 kg)   SpO2 96%   BMI 27.43 kg/m  Gen: NAD, resting comfortably HEENT: Mucous membranes are moist. Oropharynx normal Neck: no thyromegaly, well healing scar right neck CV: RRR no murmurs rubs or gallops Lungs: CTAB no crackles, wheeze, rhonchi Abdomen: soft/nontender/nondistended/normal bowel sounds. No rebound or guarding.  Ext: no edema Skin: warm, dry Neuro: grossly normal, moves all extremities, PERRLA  Declines gyn and breast exam to OB/GYN office  Assessment/Plan:  60 y.o. female presenting  for annual physical.  Health Maintenance counseling: 1. Anticipatory guidance: Patient counseled regarding regular dental exams, eye exams, wearing seatbelts.  2. Risk factor reduction:  Advised patient of need for regular exercise and diet rich and fruits and vegetables to reduce risk of heart attack and stroke.  3. Immunizations/screenings/ancillary studies Immunization History  Administered Date(s) Administered  . Influenza Whole 01/23/2007  . Td 03/28/2003   Health Maintenance Due  Topic Date Due  . TETANUS/TDAP - declines immunizations givne she had bells palsy 10 days after flu shot in past. Advised to get if she gets cut/scrape 03/27/2013   4. Cervical cancer screening- 12/23/14 with 3 year repeat likely as final pap smear through Dr. Philis Pique 5. Breast cancer screening-  breast exam through Dr. Philis Pique and mammogram 12/24/14- doing  yearly 6. Colon cancer screening - 04/22/2013 with 10 year repeat 7. Skin cancer screening- sees Dr. Ubaldo Glassing as needed  Status of chronic or acute concerns   Osteoporosis S: Walking. Has not been taking calcium/vitamin D.  A/P: we had prolonged discussion about osteoporosis today. She is pretty firmly against taking fosamax at this point- we did review thought process on taking meds and side effects of medicine. Advised at least to take 800 vitamin D and given handout for calcium to take. Will consider dexa next year which would be 5 years out- prefers to do here to gynecology office.    Hyperlipidemia 3.5% risk 2017, great HDL- remain off statin  Return precautions advised.   Garret Reddish, MD

## 2015-09-15 NOTE — Progress Notes (Signed)
Pre visit review using our clinic review tool, if applicable. No additional management support is needed unless otherwise documented below in the visit note. 

## 2015-09-15 NOTE — Patient Instructions (Addendum)
No changes except start vitamin D  Consider pushing for more calcium in diet  Follow up 1 year  Glad things went well with your surgery

## 2015-09-15 NOTE — Assessment & Plan Note (Signed)
S: Walking. Has not been taking calcium/vitamin D.  A/P: we had prolonged discussion about osteoporosis today. She is pretty firmly against taking fosamax at this point- we did review thought process on taking meds and side effects of medicine. Advised at least to take 800 vitamin D and given handout for calcium to take. Will consider dexa next year which would be 5 years out- prefers to do here to gynecology office.

## 2015-11-04 DIAGNOSIS — H25813 Combined forms of age-related cataract, bilateral: Secondary | ICD-10-CM | POA: Diagnosis not present

## 2016-03-16 DIAGNOSIS — Z01419 Encounter for gynecological examination (general) (routine) without abnormal findings: Secondary | ICD-10-CM | POA: Diagnosis not present

## 2016-03-16 DIAGNOSIS — Z6827 Body mass index (BMI) 27.0-27.9, adult: Secondary | ICD-10-CM | POA: Diagnosis not present

## 2016-03-16 DIAGNOSIS — Z1231 Encounter for screening mammogram for malignant neoplasm of breast: Secondary | ICD-10-CM | POA: Diagnosis not present

## 2016-03-16 LAB — HM MAMMOGRAPHY

## 2016-10-03 ENCOUNTER — Telehealth: Payer: Self-pay | Admitting: *Deleted

## 2016-10-03 NOTE — Telephone Encounter (Signed)
Yes thanks  Can order CBC, CMP, Lipid under hyperlipidemia Can order b12 under b12 deficiency  Any other labs will need to wait until time of visit and be discussed.

## 2016-10-03 NOTE — Telephone Encounter (Signed)
Patient called and wants to schedule a CPE with Dr. Yong Channel and she is asking about labs, Patient states she is use to getting labs a few days before and now Dr. Yong Channel does same days labs. Patient is wondering can she still get her labs a few days before her appt . Patient contact information is  [phone]. Please advise. Thank you

## 2016-10-04 ENCOUNTER — Other Ambulatory Visit: Payer: Self-pay

## 2016-10-04 DIAGNOSIS — E785 Hyperlipidemia, unspecified: Secondary | ICD-10-CM

## 2016-10-04 DIAGNOSIS — E538 Deficiency of other specified B group vitamins: Secondary | ICD-10-CM

## 2016-10-04 NOTE — Telephone Encounter (Signed)
Called and left a voicemail message asking patient to call and schedule a lab appointment

## 2016-10-04 NOTE — Telephone Encounter (Signed)
Lab orders entered. I will call patient and let her know. I will schedule her for a lab appointment.

## 2016-10-04 NOTE — Telephone Encounter (Signed)
Patient returning missed phone call. Scheduled patient for labs and CPE with Dr. Yong Channel.

## 2016-10-17 ENCOUNTER — Other Ambulatory Visit (INDEPENDENT_AMBULATORY_CARE_PROVIDER_SITE_OTHER): Payer: BLUE CROSS/BLUE SHIELD

## 2016-10-17 DIAGNOSIS — E538 Deficiency of other specified B group vitamins: Secondary | ICD-10-CM | POA: Diagnosis not present

## 2016-10-17 DIAGNOSIS — E785 Hyperlipidemia, unspecified: Secondary | ICD-10-CM

## 2016-10-17 LAB — COMPREHENSIVE METABOLIC PANEL
ALBUMIN: 4.1 g/dL (ref 3.5–5.2)
ALK PHOS: 93 U/L (ref 39–117)
ALT: 30 U/L (ref 0–35)
AST: 20 U/L (ref 0–37)
BILIRUBIN TOTAL: 0.5 mg/dL (ref 0.2–1.2)
BUN: 14 mg/dL (ref 6–23)
CO2: 28 mEq/L (ref 19–32)
CREATININE: 0.77 mg/dL (ref 0.40–1.20)
Calcium: 9.4 mg/dL (ref 8.4–10.5)
Chloride: 106 mEq/L (ref 96–112)
GFR: 80.3 mL/min (ref >60.00)
Glucose, Bld: 98 mg/dL (ref 70–99)
Potassium: 4.4 mEq/L (ref 3.5–5.1)
SODIUM: 141 meq/L (ref 135–145)
Total Protein: 7 g/dL (ref 6.0–8.3)

## 2016-10-17 LAB — LIPID PANEL
CHOLESTEROL: 238 mg/dL — AB (ref 0–200)
HDL: 86.3 mg/dL (ref >39.00)
LDL Cholesterol: 135 mg/dL — ABNORMAL HIGH (ref 0–99)
NONHDL: 151.38
Total CHOL/HDL Ratio: 3
Triglycerides: 83 mg/dL (ref 0.0–149.0)
VLDL: 16.6 mg/dL (ref 0.0–40.0)

## 2016-10-17 LAB — CBC
HEMATOCRIT: 44.6 % (ref 36.0–46.0)
Hemoglobin: 15 g/dL (ref 12.0–15.0)
MCHC: 33.5 g/dL (ref 30.0–36.0)
MCV: 104.4 fl — ABNORMAL HIGH (ref 78.0–100.0)
Platelets: 267 10*3/uL (ref 150.0–400.0)
RBC: 4.27 Mil/uL (ref 3.87–5.11)
RDW: 13.4 % (ref 11.5–15.5)
WBC: 4.3 10*3/uL (ref 4.0–10.5)

## 2016-10-17 LAB — VITAMIN B12

## 2016-10-24 ENCOUNTER — Encounter: Payer: BLUE CROSS/BLUE SHIELD | Admitting: Family Medicine

## 2016-11-21 ENCOUNTER — Encounter: Payer: BLUE CROSS/BLUE SHIELD | Admitting: Family Medicine

## 2016-12-02 ENCOUNTER — Encounter: Payer: Self-pay | Admitting: Family Medicine

## 2016-12-02 ENCOUNTER — Ambulatory Visit (INDEPENDENT_AMBULATORY_CARE_PROVIDER_SITE_OTHER)
Admission: RE | Admit: 2016-12-02 | Discharge: 2016-12-02 | Disposition: A | Discharge status: Home / Self Care | Payer: BLUE CROSS/BLUE SHIELD | Source: Ambulatory Visit | Attending: Family Medicine | Admitting: Family Medicine

## 2016-12-02 ENCOUNTER — Ambulatory Visit (INDEPENDENT_AMBULATORY_CARE_PROVIDER_SITE_OTHER): Payer: BLUE CROSS/BLUE SHIELD | Admitting: Family Medicine

## 2016-12-02 VITALS — BP 138/86 | HR 102 | Temp 98.3°F | Resp 97 | Ht 62.0 in | Wt 147.2 lb

## 2016-12-02 DIAGNOSIS — Z0000 Encounter for general adult medical examination without abnormal findings: Secondary | ICD-10-CM

## 2016-12-02 DIAGNOSIS — M810 Age-related osteoporosis without current pathological fracture: Secondary | ICD-10-CM | POA: Diagnosis not present

## 2016-12-02 NOTE — Assessment & Plan Note (Addendum)
Osteoporosis-update dexa exam. Taking vitamin D and b12 each AM. Regular walking. Still firmly against medication for this

## 2016-12-02 NOTE — Patient Instructions (Signed)
Schedule your bone density test at check out desk- they may refer you to [facility] for this  b12 looks great  Cholesterol slightly high- continue to work on healthy eating/regular exercise- another 5 lbs off would be great  Blood pressure looks ok on repeat. Weight loss can help here too  No other changes today

## 2016-12-02 NOTE — Progress Notes (Signed)
Phone: [phone]  Subjective:  Patient presents today for their annual physical. Chief complaint-noted.   See problem oriented charting- ROS- full  review of systems was completed and negative including No chest pain or shortness of breath. No headache or blurry vision.   The following were reviewed and entered/updated in epic: Past Medical History:  Diagnosis Date  . Anemia   . Diverticulosis   . Gallstones   . Headache(784.0)   . Hyperlipidemia   . Osteoporosis    Patient Active Problem List   Diagnosis Date Noted  . Vitamin B12 deficiency 04/24/2007    Priority: Medium  . Hyperlipidemia 01/23/2007    Priority: Medium  . Osteoporosis 01/23/2007    Priority: Medium  . Bell's palsy 09/05/2013    Priority: Low  . Travel advice encounter 09/05/2013    Priority: Low  . CHEST PAIN UNSPECIFIED 03/12/2008    Priority: Low  . REACTIVE HYPOGLYCEMIA 04/24/2007    Priority: Low  . ANEMIA 04/17/2007    Priority: Low  . INTERNAL HEMORRHOIDS 01/23/2007    Priority: Low   Past Surgical History:  Procedure Laterality Date  . CHOLECYSTECTOMY  03/10/2011  . matuateratoma    . OOPHORECTOMY     right  . TONSILLECTOMY    . TUBAL LIGATION      Family History  Problem Relation Age of Onset  . Diabetes Father   . Heart disease Father        MI 15  . Hypertension Mother   . Heart disease Brother        MI 63  . Cancer Paternal Grandmother        breast  . Colon cancer Neg Hx   . Colon polyps Neg Hx   . Rectal cancer Neg Hx   . Stomach cancer Neg Hx     Medications- reviewed and updated No current outpatient medications on file.   No current facility-administered medications for this visit.     Allergies-reviewed and updated Allergies  Allergen Reactions  . Aspirin     REACTION: rash    Social History   Socioeconomic History  . Marital status: Married    Spouse name: None  . Number of children: None  . Years of education: None  . Highest education  level: None  Social Needs  . Financial resource strain: None  . Food insecurity - worry: None  . Food insecurity - inability: None  . Transportation needs - medical: None  . Transportation needs - non-medical: None  Occupational History  . None  Tobacco Use  . Smoking status: Former Smoker    Packs/day: 1.25    Years: 15.00    Pack years: 18.75    Types: Cigarettes    Last attempt to quit: 06/03/2001    Years since quitting: 15.5  . Smokeless tobacco: Never Used  Substance and Sexual Activity  . Alcohol use: Yes    Comment: occasional glass of wine 1x month  . Drug use: No  . Sexual activity: None  Other Topics Concern  . None  Social History Narrative   Married 8 years. 1 kid former marriage. 3 greatgrandkids.       Retired from [employer]: walking, riding bikes, reading, cooking          Objective: BP 138/86   Pulse (!) 102   Temp 98.3 F (36.8 C) (Oral)   Resp (!) 97   Ht 5\' 2"  (1.575 m)   Wt 147  lb 4 oz (66.8 kg)   BMI 26.93 kg/m  Gen: NAD, resting comfortably HEENT: Mucous membranes are moist. Oropharynx normal Neck: no thyromegaly CV: RRR no murmurs rubs or gallops Lungs: CTAB no crackles, wheeze, rhonchi Abdomen: soft/nontender/nondistended/normal bowel sounds. No rebound or guarding.  Ext: no edema Skin: warm, dry Neuro: grossly normal, moves all extremities, PERRLA  Assessment/Plan:  61 y.o. female presenting for annual physical.  Health Maintenance counseling: 1. Anticipatory guidance: Patient counseled regarding regular dental exams q6 months, eye exams , wearing seatbelts.  2. Risk factor reduction:  Advised patient of need for regular exercise and diet rich and fruits and vegetables to reduce risk of heart attack and stroke. Exercise- zumba twice a week, also walks with husband 3x a week for 1-3 miles. Diet-lost 5 lbs from last year- goal was 10 - she is going to continue to work on this. .  Wt Readings from Last 3 Encounters:    12/02/16 147 lb 4 oz (66.8 kg)  09/15/15 152 lb 6.4 oz (69.1 kg)  06/09/15 152 lb (68.9 kg)  3. Immunizations/screenings/ancillary studies- declines flu shot given bells palsy 10 days after flu shot in past. Declines Tdap- concerned about immunizations- advised if gets cut/scrape Immunization History  Administered Date(s) Administered  . Influenza Whole 01/23/2007  . Td 03/28/2003   4. Cervical cancer screening- 12/23/14 with 3 year repeat required- she is still doing yearly with Dr. Philis Pique. Will request records next year.  5. Breast cancer screening-  breast exam through GYN and mammogram 03/2016 3d.  6. Colon cancer screening - 04/2013 with 10 year repeat 7. Skin cancer screening- prn visits with Dr. Ubaldo Glassing. advised regular sunscreen use. Denies worrisome, changing, or new skin lesions.  8. Osteoporosis - see below  Status of chronic or acute concerns   b12 deficiency- well repleted. MCV remains high and slightly higher than last year- if trends up could consider hematology evaluation  HLD- 10 year risk at 4.7%- wants to remain off statin at this level. Would consider if above 10% risk  Monitor BP- better on repeat. Still discussed weight loss with hopes of getting BP <130/80 if possible BP Readings from Last 3 Encounters:  12/02/16 138/86  09/15/15 128/88  06/09/15 122/68   Osteoporosis Osteoporosis-update dexa exam. Taking vitamin D and b12 each AM. Regular walking. Still firmly against medication for this  almost doesn't want to do dexa as states will decline meds- but think reasonable at least to make sure not drastically worsening  Orders Placed This Encounter  Procedures  . DG Bone Density    Standing Status:   Future    Standing Expiration Date:   02/01/2018    Order Specific Question:   Reason for Exam (SYMPTOM  OR DIAGNOSIS REQUIRED)    Answer:   osteoporosis follow up    Order Specific Question:   Preferred imaging location?    Answer:   [facility]  . [facility]    This external order was created through the Results Console.   Return precautions advised.  Garret Reddish, MD

## 2016-12-04 DIAGNOSIS — M810 Age-related osteoporosis without current pathological fracture: Secondary | ICD-10-CM | POA: Diagnosis not present

## 2017-04-18 ENCOUNTER — Encounter: Payer: Self-pay | Admitting: Family Medicine

## 2017-04-18 ENCOUNTER — Ambulatory Visit (INDEPENDENT_AMBULATORY_CARE_PROVIDER_SITE_OTHER): Payer: BLUE CROSS/BLUE SHIELD | Admitting: Family Medicine

## 2017-04-18 VITALS — BP 136/86 | HR 83 | Temp 97.7°F | Ht 62.0 in | Wt 146.2 lb

## 2017-04-18 DIAGNOSIS — H6593 Unspecified nonsuppurative otitis media, bilateral: Secondary | ICD-10-CM | POA: Diagnosis not present

## 2017-04-18 MED ORDER — FLUTICASONE PROPIONATE 50 MCG/ACT NA SUSP: 2.0000 | Freq: Every day | NASAL | 1 refills | Status: DC

## 2017-04-18 NOTE — Progress Notes (Signed)
Subjective:  Amy Doyle is a 62 y.o. year old very pleasant female patient who presents for/with See problem oriented charting ROS- some hearing loss. No pulsating tinnitus. No fever or chills. No rhinorrhea or sneezing   Past Medical History-  Patient Active Problem List   Diagnosis Date Noted  . Vitamin B12 deficiency 04/24/2007    Priority: Medium  . Hyperlipidemia 01/23/2007    Priority: Medium  . Osteoporosis 01/23/2007    Priority: Medium  . Bell's palsy 09/05/2013    Priority: Low  . Travel advice encounter 09/05/2013    Priority: Low  . CHEST PAIN UNSPECIFIED 03/12/2008    Priority: Low  . REACTIVE HYPOGLYCEMIA 04/24/2007    Priority: Low  . ANEMIA 04/17/2007    Priority: Low  . INTERNAL HEMORRHOIDS 01/23/2007    Priority: Low    Medications- reviewed and updated Current Outpatient Medications  Medication Sig Dispense Refill  . fluticasone (FLONASE) 50 MCG/ACT nasal spray Place 2 sprays into both nostrils daily. 16 g 1   No current facility-administered medications for this visit.     Objective: BP 136/86 (BP Location: Left Arm, Cuff Size: Large)   Pulse 83   Temp 97.7 F (36.5 C) (Oral)   Ht 5\' 2"  (1.575 m)   Wt 146 lb 3.2 oz (66.3 kg)   SpO2 97%   BMI 26.74 kg/m  Gen: NAD, resting comfortably Some edema of nasal turbinates but little discharge. TM clear on both sides but also air fluid lvels noted on both sides CV: RRR no murmurs rubs or gallops Lungs: CTAB no crackles, wheeze, rhonchi Ext: no edema Skin: warm, dry Neuro: normal gait and speech  Assessment/Plan:  Otitis media with effusion S: several days of fullness mild and tinnitus (roaring but not pulsatile) for a few days. Son apparently had ER trip for severe pain from fluid in the ears a while back and this really concerns her. Denies ear pain. auto insufflation helps slightly  She denies bad allergies but she has had a lot of atypical pollen exposure. Windows have been open as getting new  AC and getting large amount of pollen indoors A/P: Potential allergy trigger for otitis media with effusion. Will trial flonase over the next month. We discussed could take up to 3 months to resolve if not clearly allergen trigger. Could refer to ENT but with only mild hearing loss (no difficulty with our discussion today) we opted to monitor/treat allergies and refer at later date if not improving  Meds ordered this encounter  Medications  . fluticasone (FLONASE) 50 MCG/ACT nasal spray    Sig: Place 2 sprays into both nostrils daily.    Dispense:  16 g    Refill:  1    Return precautions advised.  Garret Reddish, MD

## 2017-04-18 NOTE — Patient Instructions (Signed)
I think allergies are causing your eustachian tubes to be inflamed. Lets try flonase for 1 month. If you do not improve within a month or symptoms worsen, I can refer you to ear nose and throat though sometimes this takes up to 3 months to resolve

## 2017-04-30 IMAGING — CT CT NECK W/ CM
4 of 6 series · 14 of 33 positions shown, 16 images · IV contrast (75CC ISOVUE 300)
Comparison: [HOSPITAL] paranasal sinus CT 04/12/2005.

CLINICAL DATA: 60-year-old female with submandibular region
swelling [REDACTED]. Benign needle biopsy. Former smoker. Subsequent
encounter.

Creatinine was obtained on site at [HOSPITAL] at [REDACTED].
Results: Creatinine 0.6 mg/dL.
EXAM:
CT NECK WITH CONTRAST
TECHNIQUE: Multidetector CT imaging of the neck was performed using the
standard protocol following the bolus administration of intravenous
contrast.
CONTRAST:  75mL [id number] IOPAMIDOL ([id number]) INJECTION 61%

[Series 3: axial neck · axial · 0.46mm/px · z∈[-25,+50]mm · 2 of 92 slices shown]
[im 31/92  bone]
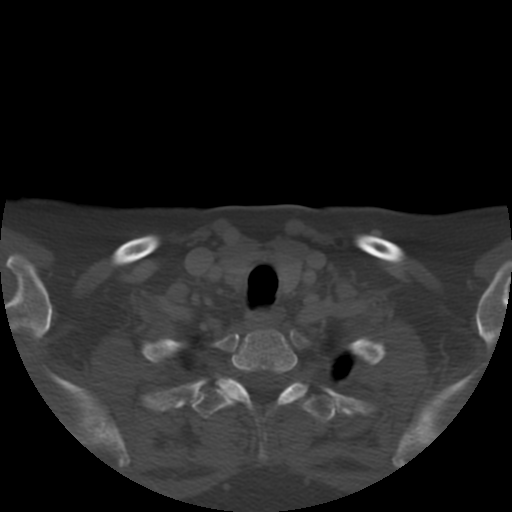
[im 61/92  bone]
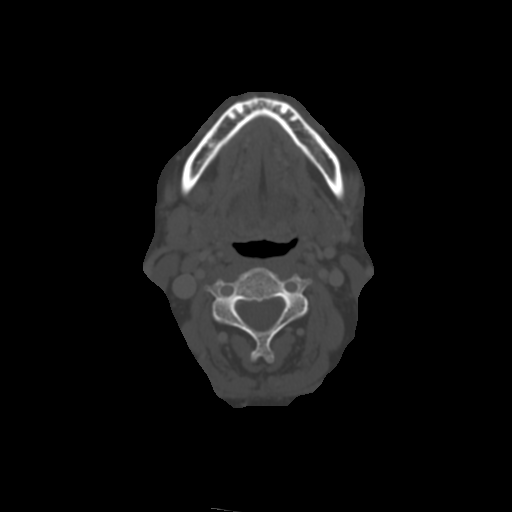

[Series 200: cor · coronal · 0.46mm/px · 3 of 109 slices shown]
[im 22/109  bone]
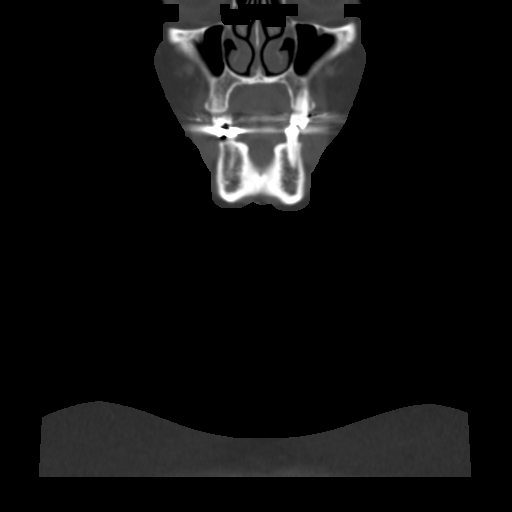
[im 44/109  bone]
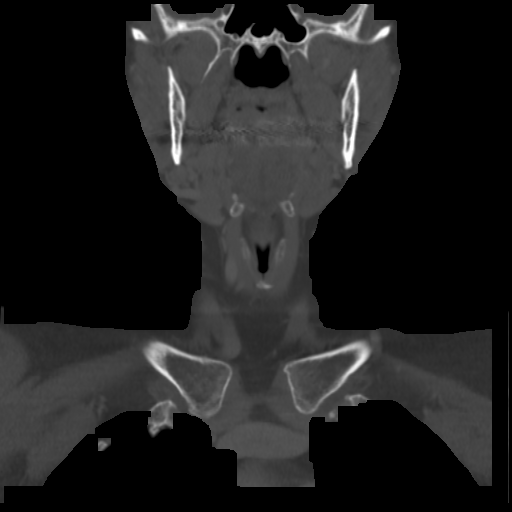
[im 65/109  bone]
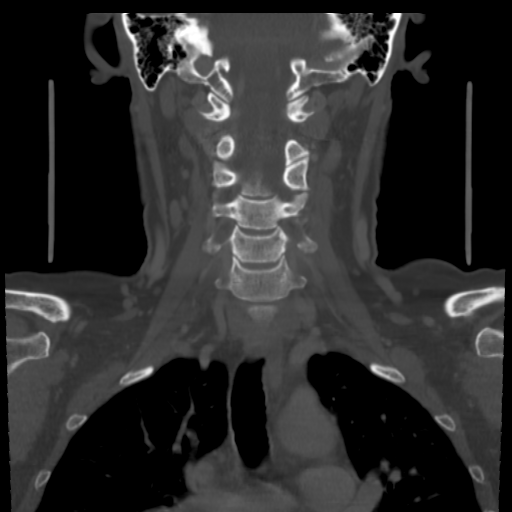

[Series 201: angled axial · axial · 0.46mm/px · z∈[-110,+40]mm · 4 of 137 slices shown, 5 images]
[im 28/137  soft-tissue]
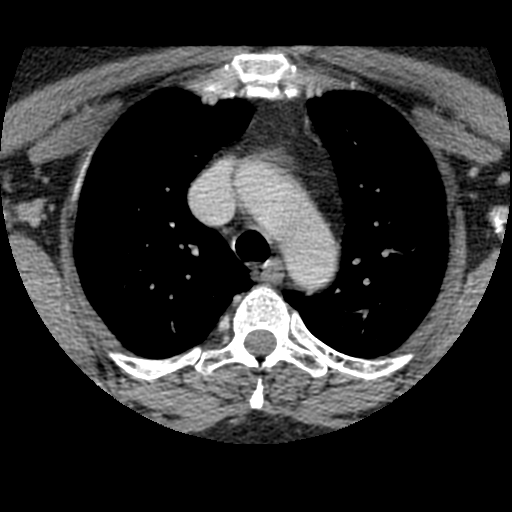
[im 28/137  bone]
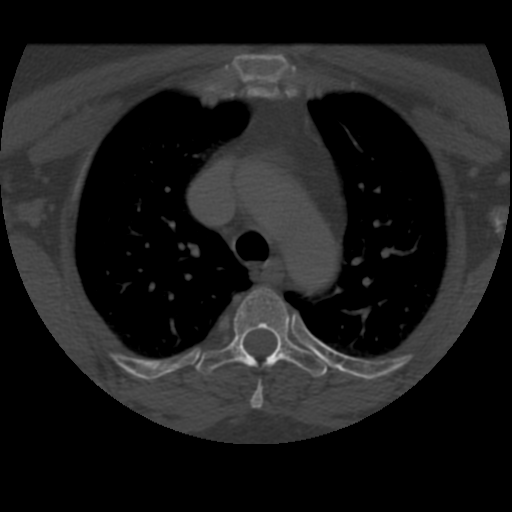
[im 55/137  bone]
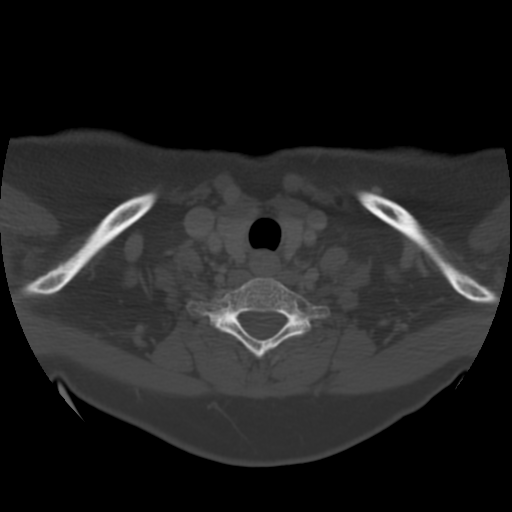
[im 82/137  bone]
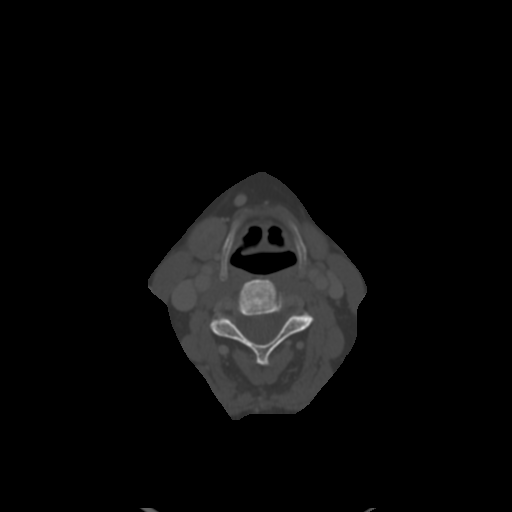
[im 109/137  bone]
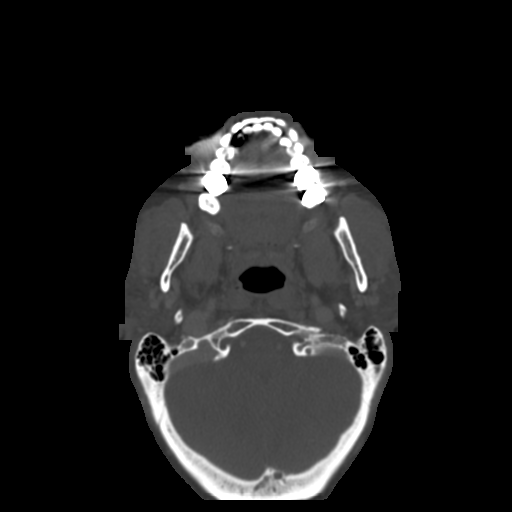

[Series 203: sag · sagittal · 0.46mm/px · 5 of 92 slices shown, 6 images]
[im 31/92  bone]
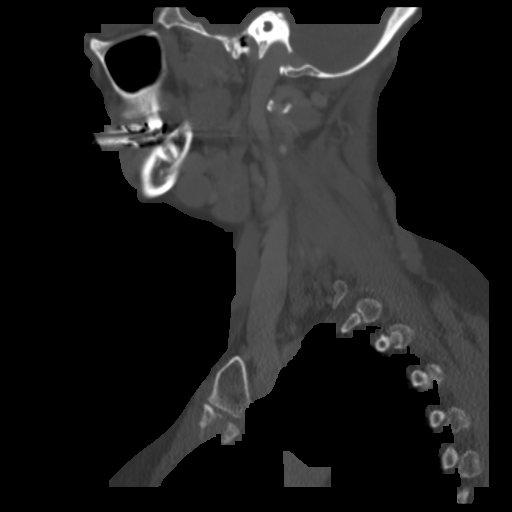
[im 38/92  bone]
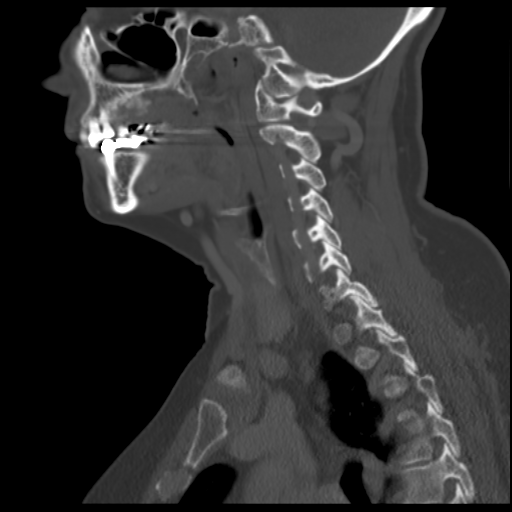
[im 46/92  soft-tissue]
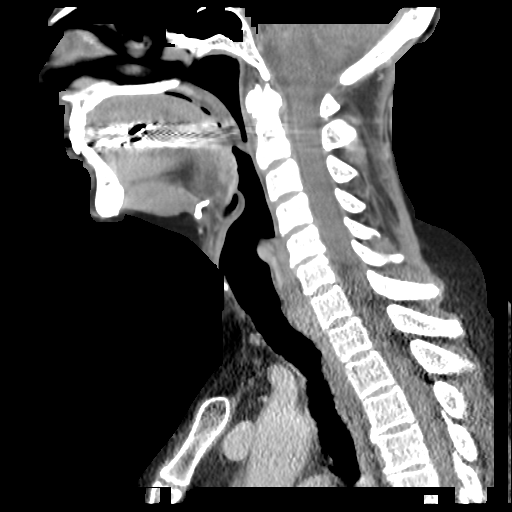
[im 46/92  bone]
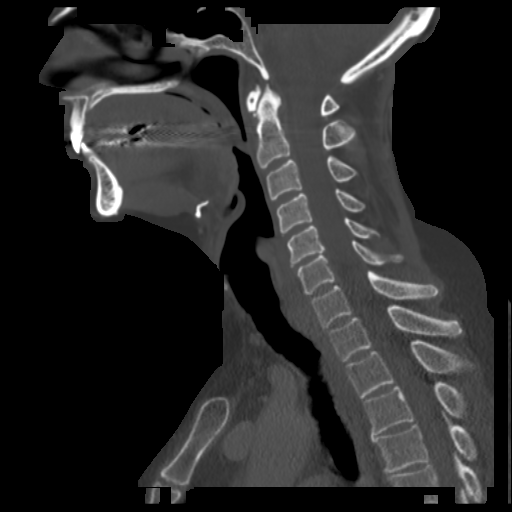
[im 54/92  bone]
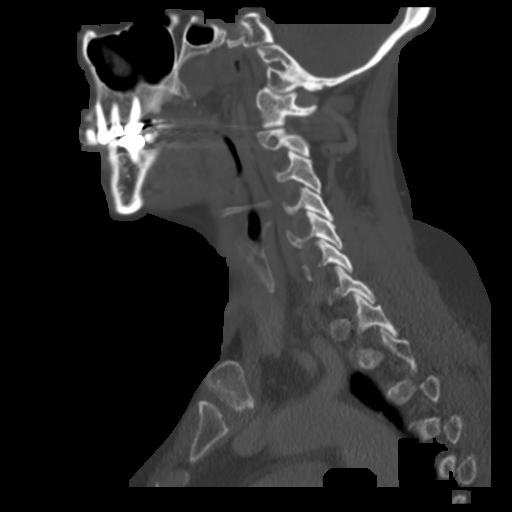
[im 61/92  bone]
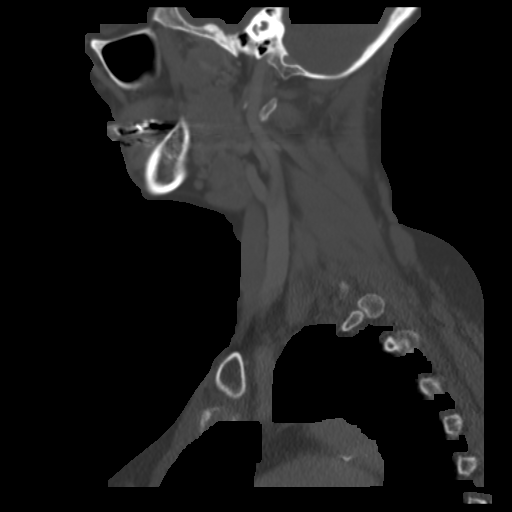

[14 of 33 positions shown; findings below may reference images not displayed]

FINDINGS: Pharynx and larynx: Mild asymmetry of the larynx in the form of mild
enlargement of the right laryngeal ventricle. There is also slight
enlargement of the right piriform sinus. Laryngeal and pharyngeal
contours otherwise are normal. Negative parapharyngeal and
retropharyngeal spaces.

Salivary glands: Negative sublingual space.

The right submandibular gland is being displaced posteriorly and
inferiorly by abnormal right level 1 B lymph nodes which
individually measure up to 14 mm short axis (series 3, image 35 and
sagittal image 32). 4-5 abnormally rounded an enlarged lymph nodes
are present at this nodal station. These are new compared to 1001.
The level 1A station is relatively spared (series 3, image 40). See
additional lymph node details below.

The right submandibular gland itself appears to remain normal. The
left submandibular gland and bilateral parotid glands are normal.

Thyroid: Negative.

Lymph nodes: Smaller but asymmetrically enlarged and up to 11 mm
short axis right level 2 and level 3 lymph nodes (down to the level
of series 3, image 46. Much smaller but also asymmetrically
conspicuous right level 4 lymph nodes near the thoracic inlet. No
cystic or necrotic nodes. All left side nodal stations are normal.

Vascular: Major vascular structures in the neck and at the skullbase
are patent and normal.

Limited intracranial: Negative.

Visualized orbits: Negative.

Mastoids and visualized paranasal sinuses: Visualized paranasal
sinuses and mastoids are stable and well pneumatized.

Skeleton: Mandible intact. No acute dental inflammatory changes
identified. No acute or suspicious osseous lesion identified.

Upper chest: No superior mediastinal or axillary lymphadenopathy.
Negative lung apices.
IMPRESSION: 1. Right submandibular region soft tissue swelling corresponds to
multiple enlarged solid right level 1B lymph nodes, up to 14 mm
short axis individually. The abnormal nodes are displacing the
normal appearing right submandibular gland posteriorly. There are
smaller but still asymmetrically enlarged right level 2 and level 3
nodes.
2. No pharyngeal primary tumor identified. No cystic or necrotic
nodes.
3. The appearance is most suspicious for lymphoproliferative
disorder such as lymphoma. Recommend further evaluation.

## 2017-05-31 DIAGNOSIS — Z01419 Encounter for gynecological examination (general) (routine) without abnormal findings: Secondary | ICD-10-CM | POA: Diagnosis not present

## 2017-05-31 DIAGNOSIS — Z1231 Encounter for screening mammogram for malignant neoplasm of breast: Secondary | ICD-10-CM | POA: Diagnosis not present

## 2017-05-31 DIAGNOSIS — Z6825 Body mass index (BMI) 25.0-25.9, adult: Secondary | ICD-10-CM | POA: Diagnosis not present

## 2017-05-31 DIAGNOSIS — Z124 Encounter for screening for malignant neoplasm of cervix: Secondary | ICD-10-CM | POA: Diagnosis not present

## 2017-05-31 LAB — HM PAP SMEAR: HM Pap smear: NEGATIVE

## 2017-06-05 ENCOUNTER — Encounter: Payer: Self-pay | Admitting: Family Medicine

## 2017-08-16 DIAGNOSIS — H25813 Combined forms of age-related cataract, bilateral: Secondary | ICD-10-CM | POA: Diagnosis not present

## 2018-03-07 DIAGNOSIS — H5203 Hypermetropia, bilateral: Secondary | ICD-10-CM | POA: Diagnosis not present

## 2018-03-07 DIAGNOSIS — H40013 Open angle with borderline findings, low risk, bilateral: Secondary | ICD-10-CM | POA: Diagnosis not present

## 2018-03-07 DIAGNOSIS — Z0100 Encounter for examination of eyes and vision without abnormal findings: Secondary | ICD-10-CM | POA: Diagnosis not present

## 2018-03-07 DIAGNOSIS — H40053 Ocular hypertension, bilateral: Secondary | ICD-10-CM | POA: Diagnosis not present

## 2018-03-07 DIAGNOSIS — H52223 Regular astigmatism, bilateral: Secondary | ICD-10-CM | POA: Diagnosis not present

## 2018-03-07 DIAGNOSIS — H524 Presbyopia: Secondary | ICD-10-CM | POA: Diagnosis not present

## 2018-03-07 DIAGNOSIS — H25813 Combined forms of age-related cataract, bilateral: Secondary | ICD-10-CM | POA: Diagnosis not present

## 2018-03-08 DIAGNOSIS — R69 Illness, unspecified: Secondary | ICD-10-CM | POA: Diagnosis not present

## 2018-03-12 DIAGNOSIS — R69 Illness, unspecified: Secondary | ICD-10-CM | POA: Diagnosis not present

## 2018-07-10 DIAGNOSIS — R69 Illness, unspecified: Secondary | ICD-10-CM | POA: Diagnosis not present

## 2018-07-24 DIAGNOSIS — Z1231 Encounter for screening mammogram for malignant neoplasm of breast: Secondary | ICD-10-CM | POA: Diagnosis not present

## 2018-07-24 DIAGNOSIS — Z01419 Encounter for gynecological examination (general) (routine) without abnormal findings: Secondary | ICD-10-CM | POA: Diagnosis not present

## 2018-07-25 DIAGNOSIS — Z0000 Encounter for general adult medical examination without abnormal findings: Secondary | ICD-10-CM | POA: Diagnosis not present

## 2018-07-25 LAB — LIPID PANEL
Cholesterol: 242 — AB (ref 0–200)
HDL: 96 — AB (ref 35–70)
LDL Cholesterol: 128
LDL Cholesterol: 18
LDl/HDL Ratio: 1.3
Triglycerides: 88 (ref 40–160)

## 2018-07-25 LAB — BASIC METABOLIC PANEL
BUN: 15 (ref 4–21)
Creatinine: 0.7 (ref <=1.1)
Glucose: 84
Sodium: 146 (ref 137–147)

## 2018-07-25 LAB — HM MAMMOGRAPHY

## 2018-07-25 LAB — CBC AND DIFFERENTIAL: WBC: 5.4

## 2018-07-25 LAB — HEMOGLOBIN A1C: Hemoglobin A1C: 5.3

## 2018-07-26 LAB — BASIC METABOLIC PANEL: Potassium: 4.8 (ref 3.4–5.3)

## 2018-07-26 LAB — HEPATIC FUNCTION PANEL
ALT: 24 (ref 7–35)
AST: 21 (ref 13–35)
Alkaline Phosphatase: 87 (ref 25–125)
Bilirubin, Total: 0.4

## 2018-07-26 LAB — TSH: TSH: 3.45 (ref 0.41–5.90)

## 2018-07-26 LAB — CBC AND DIFFERENTIAL
HCT: 44 (ref 36–46)
Hemoglobin: 15 (ref 12.0–16.0)
Neutrophils Absolute: 3
Platelets: 298 (ref 150–399)

## 2018-08-01 ENCOUNTER — Encounter: Payer: Self-pay | Admitting: Family Medicine

## 2018-08-19 ENCOUNTER — Encounter: Payer: Self-pay | Admitting: Family Medicine

## 2018-09-04 ENCOUNTER — Encounter: Payer: Self-pay | Admitting: Family Medicine

## 2018-10-30 DIAGNOSIS — R69 Illness, unspecified: Secondary | ICD-10-CM | POA: Diagnosis not present

## 2019-01-15 DIAGNOSIS — Z0100 Encounter for examination of eyes and vision without abnormal findings: Secondary | ICD-10-CM | POA: Diagnosis not present

## 2019-03-12 DIAGNOSIS — H524 Presbyopia: Secondary | ICD-10-CM | POA: Diagnosis not present

## 2019-04-22 DIAGNOSIS — R69 Illness, unspecified: Secondary | ICD-10-CM | POA: Diagnosis not present

## 2019-04-26 DIAGNOSIS — D2262 Melanocytic nevi of left upper limb, including shoulder: Secondary | ICD-10-CM | POA: Diagnosis not present

## 2019-04-26 DIAGNOSIS — D2272 Melanocytic nevi of left lower limb, including hip: Secondary | ICD-10-CM | POA: Diagnosis not present

## 2019-04-26 DIAGNOSIS — Q833 Accessory nipple: Secondary | ICD-10-CM | POA: Diagnosis not present

## 2019-04-26 DIAGNOSIS — D2261 Melanocytic nevi of right upper limb, including shoulder: Secondary | ICD-10-CM | POA: Diagnosis not present

## 2019-04-26 DIAGNOSIS — L821 Other seborrheic keratosis: Secondary | ICD-10-CM | POA: Diagnosis not present

## 2019-04-26 DIAGNOSIS — D224 Melanocytic nevi of scalp and neck: Secondary | ICD-10-CM | POA: Diagnosis not present

## 2019-04-26 DIAGNOSIS — D1801 Hemangioma of skin and subcutaneous tissue: Secondary | ICD-10-CM | POA: Diagnosis not present

## 2019-04-26 DIAGNOSIS — L814 Other melanin hyperpigmentation: Secondary | ICD-10-CM | POA: Diagnosis not present

## 2019-07-11 ENCOUNTER — Other Ambulatory Visit: Payer: Self-pay

## 2019-07-11 ENCOUNTER — Telehealth: Payer: Self-pay | Admitting: Family Medicine

## 2019-07-11 DIAGNOSIS — E785 Hyperlipidemia, unspecified: Secondary | ICD-10-CM

## 2019-07-11 DIAGNOSIS — R739 Hyperglycemia, unspecified: Secondary | ICD-10-CM

## 2019-07-11 DIAGNOSIS — Z0000 Encounter for general adult medical examination without abnormal findings: Secondary | ICD-10-CM

## 2019-07-11 DIAGNOSIS — M810 Age-related osteoporosis without current pathological fracture: Secondary | ICD-10-CM

## 2019-07-11 DIAGNOSIS — E538 Deficiency of other specified B group vitamins: Secondary | ICD-10-CM

## 2019-07-11 NOTE — Telephone Encounter (Signed)
Patient is scheduled for a physical on 9/27 but would like to know if she can do lab work the Friday before the appointment so they are able to talk about them.

## 2019-07-11 NOTE — Addendum Note (Signed)
Addended by: Marin Olp on: 07/11/2019 05:29 PM   Modules accepted: Orders

## 2019-07-11 NOTE — Telephone Encounter (Signed)
Labs ordered, ok to call and schedule pt for lab visit. 

## 2019-07-11 NOTE — Telephone Encounter (Signed)
Patient is scheduled for 9/24 for lab work.

## 2019-07-11 NOTE — Progress Notes (Signed)
Error

## 2019-09-27 ENCOUNTER — Other Ambulatory Visit: Payer: Medicare HMO

## 2019-09-27 ENCOUNTER — Other Ambulatory Visit: Payer: Self-pay

## 2019-09-27 DIAGNOSIS — Z0000 Encounter for general adult medical examination without abnormal findings: Secondary | ICD-10-CM

## 2019-09-27 DIAGNOSIS — M810 Age-related osteoporosis without current pathological fracture: Secondary | ICD-10-CM | POA: Diagnosis not present

## 2019-09-27 DIAGNOSIS — E785 Hyperlipidemia, unspecified: Secondary | ICD-10-CM | POA: Diagnosis not present

## 2019-09-27 DIAGNOSIS — E538 Deficiency of other specified B group vitamins: Secondary | ICD-10-CM

## 2019-09-27 DIAGNOSIS — R739 Hyperglycemia, unspecified: Secondary | ICD-10-CM | POA: Diagnosis not present

## 2019-09-27 NOTE — Addendum Note (Signed)
Addended by: Milton Ferguson D on: 09/27/2019 08:43 AM   Modules accepted: Orders

## 2019-09-28 LAB — CBC WITH DIFFERENTIAL/PLATELET
Absolute Monocytes: 667 cells/uL (ref 200–950)
Basophils Absolute: 29 cells/uL (ref 0–200)
Basophils Relative: 0.5 %
Eosinophils Absolute: 40 cells/uL (ref 15–500)
Eosinophils Relative: 0.7 %
HCT: 44.1 % (ref 35.0–45.0)
Hemoglobin: 14.9 g/dL (ref 11.7–15.5)
Lymphs Abs: 1180 cells/uL (ref 850–3900)
MCH: 34.7 pg — ABNORMAL HIGH (ref 27.0–33.0)
MCHC: 33.8 g/dL (ref 32.0–36.0)
MCV: 102.8 fL — ABNORMAL HIGH (ref 80.0–100.0)
MPV: 11.7 fL (ref 7.5–12.5)
Monocytes Relative: 11.7 %
Neutro Abs: 3785 cells/uL (ref 1500–7800)
Neutrophils Relative %: 66.4 %
Platelets: 276 10*3/uL (ref 140–400)
RBC: 4.29 10*6/uL (ref 3.80–5.10)
RDW: 11.8 % (ref 11.0–15.0)
Total Lymphocyte: 20.7 %
WBC: 5.7 10*3/uL (ref 3.8–10.8)

## 2019-09-28 LAB — COMPREHENSIVE METABOLIC PANEL
AG Ratio: 1.8 (calc) (ref 1.0–2.5)
ALT: 29 U/L (ref 6–29)
AST: 21 U/L (ref 10–35)
Albumin: 4.2 g/dL (ref 3.6–5.1)
Alkaline phosphatase (APISO): 81 U/L (ref 37–153)
BUN: 15 mg/dL (ref 7–25)
CO2: 29 mmol/L (ref 20–32)
Calcium: 9.8 mg/dL (ref 8.6–10.4)
Chloride: 104 mmol/L (ref 98–110)
Creat: 0.69 mg/dL (ref 0.50–0.99)
Globulin: 2.4 g/dL (calc) (ref 1.9–3.7)
Glucose, Bld: 84 mg/dL (ref 65–99)
Potassium: 4.5 mmol/L (ref 3.5–5.3)
Sodium: 140 mmol/L (ref 135–146)
Total Bilirubin: 0.5 mg/dL (ref 0.2–1.2)
Total Protein: 6.6 g/dL (ref 6.1–8.1)

## 2019-09-28 LAB — HEMOGLOBIN A1C
Hgb A1c MFr Bld: 5.1 % of total Hgb (ref <5.7)
Mean Plasma Glucose: 100 (calc)
eAG (mmol/L): 5.5 (calc)

## 2019-09-28 LAB — LIPID PANEL
Cholesterol: 252 mg/dL — ABNORMAL HIGH (ref <200)
HDL: 102 mg/dL (ref >=50)
LDL Cholesterol (Calc): 131 mg/dL (calc) — ABNORMAL HIGH
Non-HDL Cholesterol (Calc): 150 mg/dL (calc) — ABNORMAL HIGH (ref <130)
Total CHOL/HDL Ratio: 2.5 (calc) (ref <5.0)
Triglycerides: 87 mg/dL (ref <150)

## 2019-09-28 LAB — VITAMIN B12: Vitamin B-12: 664 pg/mL (ref 200–1100)

## 2019-09-28 LAB — VITAMIN D 25 HYDROXY (VIT D DEFICIENCY, FRACTURES): Vit D, 25-Hydroxy: 34 ng/mL (ref 30–100)

## 2019-09-29 NOTE — Progress Notes (Signed)
Phone [phone]   Subjective:  Patient presents today for their annual physical. Chief complaint-noted.   See problem oriented charting- ROS- full  review of systems was completed and negative except for: activity change- less working out in gym due to covid  The following were reviewed and entered/updated in epic: Past Medical History:  Diagnosis Date  . Anemia   . Diverticulosis   . Gallstones   . Headache(784.0)   . Hyperlipidemia   . Osteoporosis    Patient Active Problem List   Diagnosis Date Noted  . Vaccination reaction 09/30/2019    Priority: High  . Vitamin B12 deficiency 04/24/2007    Priority: Medium  . Hyperlipidemia 01/23/2007    Priority: Medium  . Osteoporosis 01/23/2007    Priority: Medium  . Bell's palsy 09/05/2013    Priority: Low  . Travel advice encounter 09/05/2013    Priority: Low  . CHEST PAIN UNSPECIFIED 03/12/2008    Priority: Low  . REACTIVE HYPOGLYCEMIA 04/24/2007    Priority: Low  . ANEMIA 04/17/2007    Priority: Low  . INTERNAL HEMORRHOIDS 01/23/2007    Priority: Low   Past Surgical History:  Procedure Laterality Date  . CHOLECYSTECTOMY  03/10/2011  . matuateratoma    . OOPHORECTOMY     right  . TONSILLECTOMY    . TUBAL LIGATION      Family History  Problem Relation Age of Onset  . Diabetes Father   . Heart disease Father        MI 22  . Hypertension Mother   . Heart disease Brother        MI 45  . Cancer Paternal Grandmother        breast  . Colon cancer Neg Hx   . Colon polyps Neg Hx   . Rectal cancer Neg Hx   . Stomach cancer Neg Hx    Medications- reviewed and updated Current Outpatient Medications  Medication Sig Dispense Refill  . Ascorbic Acid (VITAMIN C) 1000 MG tablet Take 1,000 mg by mouth daily.    Marland Kitchen CALCIUM CITRATE PO Take 250 mg by mouth daily.    . cholecalciferol (VITAMIN D) 25 MCG (1000 UNIT) tablet Take 2,000 Units by mouth daily.    . Multiple Vitamins-Minerals (ZINC PO) Take 20 mg by mouth  daily.    . vitamin B-12 (CYANOCOBALAMIN) 1000 MCG tablet Take 1,000 mcg by mouth daily.     No current facility-administered medications for this visit.    Allergies-reviewed and updated Allergies  Allergen Reactions  . Aspirin     REACTION: rash  . Haemophilus Influenzae Vaccines     Bells palsy 10 days afterwards- concern for recurrence with other vaccines and declines    Social History   Social History Narrative   Married 8 years. 1 kid former marriage. 3 greatgrandkids.       Retired from [employer]: walking, riding bikes, reading, cooking         Objective  Objective:  BP 118/80   Pulse 75   Temp 98.6 F (37 C) (Temporal)   Resp 18   Ht 5\' 2"  (1.575 m)   Wt 144 lb 6.4 oz (65.5 kg)   SpO2 97%   BMI 26.41 kg/m  Gen: NAD, resting comfortably HEENT: Mucous membranes are moist. Oropharynx normal Neck: no thyromegaly CV: RRR no murmurs rubs or gallops Lungs: CTAB no crackles, wheeze, rhonchi Abdomen: soft/nontender/nondistended/normal bowel sounds. No rebound or guarding.  Ext: no edema  Skin: warm, dry Neuro: grossly normal, moves all extremities, PERRLA we are simply we could have fluid a.m. today virus   Assessment and Plan   64 y.o. female presenting for annual physical.  Health Maintenance counseling: 1. Anticipatory guidance: Patient counseled regarding regular dental exams -q6 months, eye exams - yearly,  avoiding smoking and second hand smoke , limiting alcohol to 1 beverage per day .   2. Risk factor reduction:  Advised patient of need for regular exercise and diet rich and fruits and vegetables to reduce risk of heart attack and stroke. Exercise- trying to get restarted on this. Diet-reasonably healthy habits.  Down 3 pounds from last physical. States had gotten down 4 more lbs but has slacked up some recently Wt Readings from Last 3 Encounters:  09/30/19 144 lb 6.4 oz (65.5 kg)  04/18/17 146 lb 3.2 oz (66.3 kg)  12/02/16 147 lb 4 oz (66.8  kg)  3. Immunizations/screenings/ancillary studies- declines immunizations (COVID-19, tetanus-would certainly need if gets cut/scraped, Pneumovax) given she had bells palsy 10 days after flu shot in past.  Immunization History  Administered Date(s) Administered  . Influenza Whole 01/23/2007  . Td 03/28/2003   4. Cervical cancer screening- follows with Dr. Philis Pique- shared decision making and going every 2 years for pap smear though past formal age based screening recommendations 5. Breast cancer screening-  breast exam through GYN and mammogram 07/25/2018- she will call to schedule 6. Colon cancer screening -April 2015 with 10-year repeat planned 7. Skin cancer screening-as needed visits with Dr. Ubaldo Glassing regular dermatology- just had that done. advised regular sunscreen use. Denies worrisome, changing, or new skin lesions.  8. Birth control/STD check- postmenopausal/monogamous 9. Osteoporosis screening at 67- see below -Former smoker-quit 2003 and under 20 pack years.  We will get urinalysis      Status of chronic or acute concerns   #hyperlipidemia S: Medication: none  Lab Results  Component Value Date   CHOL 252 (H) 09/27/2019   HDL 102 09/27/2019   LDLCALC 131 (H) 09/27/2019   LDLDIRECT 104.1 02/27/2012   TRIG 87 09/27/2019   CHOLHDL 2.5 09/27/2019   A/P: Cholesterol high but not in range has to start medicine (plus excellent HDL) remain off statin for now  # B12 deficiency S: Current treatment/medication (oral vs. IM): 1000 mcg a day  Lab Results  Component Value Date   VITAMINB12 664 09/27/2019  A/P: MCV still high but b12 improved- unless other cell line issues hold off on hematology referral  # Osteoporosis S: Last DEXA: 12/02/2016   Medication (bisphosphonate or prolia): has opted out  Calcium: 1200mg  (through diet ok) recommended - only 250 mg calcium citrate plus some through diet Vitamin D: 1000 units a day recommended in summer and 2000 in winter Last vitamin  D Lab Results  Component Value Date   VD25OH 34 09/27/2019   Walking in the past- encouraged to restart   A/P: states would not take medicine like fosamax even if levels worsened so opts out of dexa for now- rediscus next year   Recommended follow up: Return in about 1 year (around 09/29/2020) for physical or sooner if needed.  Lab/Order associations: already had labs   ICD-10-CM   1. Preventative health care  Z00.00   2. Hyperlipidemia, unspecified hyperlipidemia type  E78.5 POCT Urinalysis Dipstick (Automated)    CANCELED: CBC With Differential/Platelet    CANCELED: COMPLETE METABOLIC PANEL WITH GFR    CANCELED: Lipid Panel (Refl)  3. Vitamin B12 deficiency  E53.8 CANCELED: Vitamin B12  4. Age-related osteoporosis without current pathological fracture  M81.0 CANCELED: VITAMIN D 25 Hydroxy (Vit-D Deficiency, Fractures)  5. Adverse effect of vaccine, initial encounter  T50.Z95A   6. Former smoker  Z87.891 POCT Urinalysis Dipstick (Automated)   Return precautions advised.  Garret Reddish, MD

## 2019-09-29 NOTE — Patient Instructions (Addendum)
Health Maintenance Due  Topic Date Due  . COVID-19 Vaccine (1) declines due to prior reaction  Never done  . TETANUS/TDAP - declines due to prior reaction  03/27/2013  . PNA vac Low Risk Adult (1 of 2 - PCV13) declines due to prior reaction  Never done   No changes today- glad you are doing well!

## 2019-09-30 ENCOUNTER — Telehealth: Payer: Self-pay | Admitting: Family Medicine

## 2019-09-30 ENCOUNTER — Encounter: Payer: Self-pay | Admitting: Family Medicine

## 2019-09-30 ENCOUNTER — Ambulatory Visit (INDEPENDENT_AMBULATORY_CARE_PROVIDER_SITE_OTHER): Payer: Medicare HMO | Admitting: Family Medicine

## 2019-09-30 ENCOUNTER — Other Ambulatory Visit: Payer: Self-pay

## 2019-09-30 VITALS — BP 118/80 | HR 75 | Temp 98.6°F | Resp 18 | Ht 62.0 in | Wt 144.4 lb

## 2019-09-30 DIAGNOSIS — M810 Age-related osteoporosis without current pathological fracture: Secondary | ICD-10-CM

## 2019-09-30 DIAGNOSIS — Z87891 Personal history of nicotine dependence: Secondary | ICD-10-CM | POA: Diagnosis not present

## 2019-09-30 DIAGNOSIS — Z0000 Encounter for general adult medical examination without abnormal findings: Secondary | ICD-10-CM | POA: Diagnosis not present

## 2019-09-30 DIAGNOSIS — E538 Deficiency of other specified B group vitamins: Secondary | ICD-10-CM

## 2019-09-30 DIAGNOSIS — T50Z95A Adverse effect of other vaccines and biological substances, initial encounter: Secondary | ICD-10-CM

## 2019-09-30 DIAGNOSIS — E785 Hyperlipidemia, unspecified: Secondary | ICD-10-CM | POA: Diagnosis not present

## 2019-09-30 LAB — POC URINALSYSI DIPSTICK (AUTOMATED)
Bilirubin, UA: NEGATIVE
Blood, UA: NEGATIVE
Glucose, UA: NEGATIVE
Ketones, UA: NEGATIVE
Leukocytes, UA: NEGATIVE
Nitrite, UA: NEGATIVE
Protein, UA: NEGATIVE
Spec Grav, UA: 1.025 (ref 1.010–1.025)
Urobilinogen, UA: 0.2 E.U./dL
pH, UA: 5.5 (ref 5.0–8.0)

## 2019-09-30 NOTE — Telephone Encounter (Signed)
Patient called and wanted to speak to brittany about some paperwork. Patient would like called back at [phone].

## 2019-09-30 NOTE — Addendum Note (Signed)
Addended by: Thomes Cake on: 09/30/2019 03:11 PM   Modules accepted: Orders

## 2019-10-01 ENCOUNTER — Encounter: Payer: Self-pay | Admitting: Family Medicine

## 2019-10-03 NOTE — Telephone Encounter (Signed)
LVM for the patient to return my call here at the office. Office number was provided.  

## 2019-10-28 DIAGNOSIS — R69 Illness, unspecified: Secondary | ICD-10-CM | POA: Diagnosis not present

## 2019-10-30 DIAGNOSIS — Z1231 Encounter for screening mammogram for malignant neoplasm of breast: Secondary | ICD-10-CM | POA: Diagnosis not present

## 2020-01-13 ENCOUNTER — Telehealth: Payer: Self-pay | Admitting: Family Medicine

## 2020-01-13 NOTE — Telephone Encounter (Signed)
error 

## 2020-01-16 ENCOUNTER — Other Ambulatory Visit: Payer: Self-pay

## 2020-01-17 ENCOUNTER — Ambulatory Visit (INDEPENDENT_AMBULATORY_CARE_PROVIDER_SITE_OTHER): Payer: Medicare HMO

## 2020-01-17 DIAGNOSIS — Z0000 Encounter for general adult medical examination without abnormal findings: Secondary | ICD-10-CM | POA: Diagnosis not present

## 2020-01-17 NOTE — Patient Instructions (Addendum)
Amy Doyle , Thank you for taking time to come for your Medicare Wellness Visit. I appreciate your ongoing commitment to your health goals. Please review the following plan we discussed and let me know if I can assist you in the future.   Screening recommendations/referrals: Colonoscopy: Done 04/22/13 Mammogram: Done 07/25/18 Bone Density: Done 12/02/16 Recommended yearly ophthalmology/optometry visit for glaucoma screening and checkup Recommended yearly dental visit for hygiene and checkup  Vaccinations: No vaccines administered related to Bells Palsey history Influenza vaccine: N/A Pneumococcal vaccine: N/A Tdap vaccine: N/A Shingles vaccine: NA   Covid-19:N/A  Advanced directives: Please bring a copy of your health care power of attorney and living will to the office at your convenience.  Conditions/risks identified: None at this time  Next appointment: Follow up in one year for your annual wellness visit    Preventive Care 65 Years and Older, Female Preventive care refers to lifestyle choices and visits with your health care provider that can promote health and wellness. What does preventive care include?  A yearly physical exam. This is also called an annual well check.  Dental exams once or twice a year.  Routine eye exams. Ask your health care provider how often you should have your eyes checked.  Personal lifestyle choices, including:  Daily care of your teeth and gums.  Regular physical activity.  Eating a healthy diet.  Avoiding tobacco and drug use.  Limiting alcohol use.  Practicing safe sex.  Taking low-dose aspirin every day.  Taking vitamin and mineral supplements as recommended by your health care provider. What happens during an annual well check? The services and screenings done by your health care provider during your annual well check will depend on your age, overall health, lifestyle risk factors, and family history of disease. Counseling  Your  health care provider may ask you questions about your:  Alcohol use.  Tobacco use.  Drug use.  Emotional well-being.  Home and relationship well-being.  Sexual activity.  Eating habits.  History of falls.  Memory and ability to understand (cognition).  Work and work Statistician.  Reproductive health. Screening  You may have the following tests or measurements:  Height, weight, and BMI.  Blood pressure.  Lipid and cholesterol levels. These may be checked every 5 years, or more frequently if you are over 2 years old.  Skin check.  Lung cancer screening. You may have this screening every year starting at age 67 if you have a 30-pack-year history of smoking and currently smoke or have quit within the past 15 years.  Fecal occult blood test (FOBT) of the stool. You may have this test every year starting at age 69.  Flexible sigmoidoscopy or colonoscopy. You may have a sigmoidoscopy every 5 years or a colonoscopy every 10 years starting at age 14.  Hepatitis C blood test.  Hepatitis B blood test.  Sexually transmitted disease (STD) testing.  Diabetes screening. This is done by checking your blood sugar (glucose) after you have not eaten for a while (fasting). You may have this done every 1-3 years.  Bone density scan. This is done to screen for osteoporosis. You may have this done starting at age 47.  Mammogram. This may be done every 1-2 years. Talk to your health care provider about how often you should have regular mammograms. Talk with your health care provider about your test results, treatment options, and if necessary, the need for more tests. Vaccines  Your health care provider may recommend certain vaccines, such  as:  Influenza vaccine. This is recommended every year.  Tetanus, diphtheria, and acellular pertussis (Tdap, Td) vaccine. You may need a Td booster every 10 years.  Zoster vaccine. You may need this after age 63.  Pneumococcal 13-valent  conjugate (PCV13) vaccine. One dose is recommended after age 43.  Pneumococcal polysaccharide (PPSV23) vaccine. One dose is recommended after age 54. Talk to your health care provider about which screenings and vaccines you need and how often you need them. This information is not intended to replace advice given to you by your health care provider. Make sure you discuss any questions you have with your health care provider. Document Released: 01/16/2015 Document Revised: 09/09/2015 Document Reviewed: 10/21/2014 Elsevier Interactive Patient Education  2017 [employer] Prevention in the Home Falls can cause injuries. They can happen to people of all ages. There are many things you can do to make your home safe and to help prevent falls. What can I do on the outside of my home?  Regularly fix the edges of walkways and driveways and fix any cracks.  Remove anything that might make you trip as you walk through a door, such as a raised step or threshold.  Trim any bushes or trees on the path to your home.  Use bright outdoor lighting.  Clear any walking paths of anything that might make someone trip, such as rocks or tools.  Regularly check to see if handrails are loose or broken. Make sure that both sides of any steps have handrails.  Any raised decks and porches should have guardrails on the edges.  Have any leaves, snow, or ice cleared regularly.  Use sand or salt on walking paths during winter.  Clean up any spills in your garage right away. This includes oil or grease spills. What can I do in the bathroom?  Use night lights.  Install grab bars by the toilet and in the tub and shower. Do not use towel bars as grab bars.  Use non-skid mats or decals in the tub or shower.  If you need to sit down in the shower, use a plastic, non-slip stool.  Keep the floor dry. Clean up any water that spills on the floor as soon as it happens.  Remove soap buildup in the tub or  shower regularly.  Attach bath mats securely with double-sided non-slip rug tape.  Do not have throw rugs and other things on the floor that can make you trip. What can I do in the bedroom?  Use night lights.  Make sure that you have a light by your bed that is easy to reach.  Do not use any sheets or blankets that are too big for your bed. They should not hang down onto the floor.  Have a firm chair that has side arms. You can use this for support while you get dressed.  Do not have throw rugs and other things on the floor that can make you trip. What can I do in the kitchen?  Clean up any spills right away.  Avoid walking on wet floors.  Keep items that you use a lot in easy-to-reach places.  If you need to reach something above you, use a strong step stool that has a grab bar.  Keep electrical cords out of the way.  Do not use floor polish or wax that makes floors slippery. If you must use wax, use non-skid floor wax.  Do not have throw rugs and other things on the  floor that can make you trip. What can I do with my stairs?  Do not leave any items on the stairs.  Make sure that there are handrails on both sides of the stairs and use them. Fix handrails that are broken or loose. Make sure that handrails are as long as the stairways.  Check any carpeting to make sure that it is firmly attached to the stairs. Fix any carpet that is loose or worn.  Avoid having throw rugs at the top or bottom of the stairs. If you do have throw rugs, attach them to the floor with carpet tape.  Make sure that you have a light switch at the top of the stairs and the bottom of the stairs. If you do not have them, ask someone to add them for you. What else can I do to help prevent falls?  Wear shoes that:  Do not have high heels.  Have rubber bottoms.  Are comfortable and fit you well.  Are closed at the toe. Do not wear sandals.  If you use a stepladder:  Make sure that it is fully  opened. Do not climb a closed stepladder.  Make sure that both sides of the stepladder are locked into place.  Ask someone to hold it for you, if possible.  Clearly mark and make sure that you can see:  Any grab bars or handrails.  First and last steps.  Where the edge of each step is.  Use tools that help you move around (mobility aids) if they are needed. These include:  Canes.  Walkers.  Scooters.  Crutches.  Turn on the lights when you go into a dark area. Replace any light bulbs as soon as they burn out.  Set up your furniture so you have a clear path. Avoid moving your furniture around.  If any of your floors are uneven, fix them.  If there are any pets around you, be aware of where they are.  Review your medicines with your doctor. Some medicines can make you feel dizzy. This can increase your chance of falling. Ask your doctor what other things that you can do to help prevent falls. This information is not intended to replace advice given to you by your health care provider. Make sure you discuss any questions you have with your health care provider. Document Released: 10/16/2008 Document Revised: 05/28/2015 Document Reviewed: 01/24/2014 Elsevier Interactive Patient Education  2017 [employer].

## 2020-01-17 NOTE — Progress Notes (Addendum)
Virtual Visit via Telephone Note  I connected with  Amy Doyle on 01/17/20 at  1:00 PM EST by telephone and verified that I am speaking with the correct person using two identifiers.  Medicare Annual Wellness visit completed telephonically due to Covid-19 pandemic.   Persons participating in this call: This Health Coach and this patient.   Location: Patient: Home Provider: Office   I discussed the limitations, risks, security and privacy concerns of performing an evaluation and management service by telephone and the availability of in person appointments. The patient expressed understanding and agreed to proceed.  Unable to perform video visit due to video visit attempted and failed and/or patient does not have video capability.   Some vital signs may be absent or patient reported.   Willette Brace, LPN    Subjective:   Amy Doyle is a 65 y.o. female who presents for an Initial Medicare Annual Wellness Visit.  Review of Systems     Cardiac Risk Factors include: advanced age (>26men, >64 women);dyslipidemia     Objective:    There were no vitals filed for this visit. There is no height or weight on file to calculate BMI.  Advanced Directives 01/17/2020 04/22/2013  Does Patient Have a Medical Advance Directive? Yes Patient has advance directive, copy not in chart  Type of Advance Directive [place];Living will -  Copy of [place] in Chart? No - copy requested -    Current Medications (verified) Outpatient Encounter Medications as of 01/17/2020  Medication Sig  . Ascorbic Acid (VITAMIN C) 1000 MG tablet Take 1,000 mg by mouth daily.  Marland Kitchen CALCIUM CITRATE PO Take 250 mg by mouth daily.  . cholecalciferol (VITAMIN D) 25 MCG (1000 UNIT) tablet Take 2,000 Units by mouth daily.  . vitamin B-12 (CYANOCOBALAMIN) 1000 MCG tablet Take 1,000 mcg by mouth daily.  . Zinc 25 MG TABS Take by mouth.  . [DISCONTINUED] Multiple Vitamins-Minerals  (ZINC PO) Take 20 mg by mouth daily. (Patient not taking: Reported on 01/17/2020)   No facility-administered encounter medications on file as of 01/17/2020.    Allergies (verified) Aspirin and Haemophilus influenzae vaccines   History: Past Medical History:  Diagnosis Date  . Anemia   . Diverticulosis   . Gallstones   . Headache(784.0)   . Hyperlipidemia   . Osteoporosis    Past Surgical History:  Procedure Laterality Date  . CHOLECYSTECTOMY  03/10/2011  . matuateratoma    . OOPHORECTOMY     right  . TONSILLECTOMY    . TUBAL LIGATION     Family History  Problem Relation Age of Onset  . Diabetes Father   . Heart disease Father        MI 74  . Hypertension Mother   . Heart disease Brother        MI 17  . Cancer Paternal Grandmother        breast  . Colon cancer Neg Hx   . Colon polyps Neg Hx   . Rectal cancer Neg Hx   . Stomach cancer Neg Hx    Social History   Socioeconomic History  . Marital status: Married    Spouse name: Not on file  . Number of children: Not on file  . Years of education: Not on file  . Highest education level: Not on file  Occupational History  . Occupation: retired  Tobacco Use  . Smoking status: Former Smoker    Packs/day: 1.25  Years: 15.00    Pack years: 18.75    Types: Cigarettes    Quit date: 06/03/2001    Years since quitting: 18.6  . Smokeless tobacco: Never Used  Substance and Sexual Activity  . Alcohol use: Yes    Comment: occasional glass of wine 1x month  . Drug use: No  . Sexual activity: Not on file  Other Topics Concern  . Not on file  Social History Narrative   Married 8 years. 1 kid former marriage. 3 greatgrandkids.       Retired from [employer]: walking, riding bikes, reading, cooking         Social Determinants of Corporate investment banker Strain: Low Risk   . Difficulty of Paying Living Expenses: Not hard at all  Food Insecurity: No Food Insecurity  . Worried About Brewing technologist in the Last Year: Never true  . Ran Out of Food in the Last Year: Never true  Transportation Needs: No Transportation Needs  . Lack of Transportation (Medical): No  . Lack of Transportation (Non-Medical): No  Physical Activity: Sufficiently Active  . Days of Exercise per Week: 4 days  . Minutes of Exercise per Session: 60 min  Stress: No Stress Concern Present  . Feeling of Stress : Not at all  Social Connections: Socially Integrated  . Frequency of Communication with Friends and Family: More than three times a week  . Frequency of Social Gatherings with Friends and Family: Once a week  . Attends Religious Services: More than 4 times per year  . Active Member of Clubs or Organizations: Yes  . Attends Banker Meetings: 1 to 4 times per year  . Marital Status: Married    Tobacco Counseling Counseling given: Not Answered   Clinical Intake:  Pre-visit preparation completed: Yes  Pain : No/denies pain     BMI - recorded: 26.41 Nutritional Status: BMI 25 -29 Overweight Nutritional Risks: None Diabetes: No  How often do you need to have someone help you when you read instructions, pamphlets, or other written materials from your doctor or pharmacy?: 1 - Never  Diabetic?No  Interpreter Needed?: No  Information entered by :: Lanier Ensign, LPN   Activities of Daily Living In your present state of health, do you have any difficulty performing the following activities: 01/17/2020 09/30/2019  Hearing? N N  Vision? N N  Difficulty concentrating or making decisions? N N  Walking or climbing stairs? N N  Dressing or bathing? N N  Doing errands, shopping? N N  Preparing Food and eating ? N -  Using the Toilet? N -  In the past six months, have you accidently leaked urine? N -  Do you have problems with loss of bowel control? N -  Managing your Medications? N -  Managing your Finances? N -  Housekeeping or managing your Housekeeping? N -  Some recent data  might be hidden    Patient Care Team: Shelva Majestic, MD as PCP - General (Family Medicine)  Indicate any recent Medical Services you may have received from other than [facility] providers in the past year (date may be approximate).     Assessment:   This is a routine wellness examination for Amy Doyle.  Hearing/Vision screen  Hearing Screening   125Hz  250Hz  500Hz  1000Hz  2000Hz  3000Hz  4000Hz  6000Hz  8000Hz   Right ear:           Left ear:  Comments: Pt denies any difficulty hearing   Vision Screening Comments: Pt follows up with Dr Blima Ledger for annual eye exams  Dietary issues and exercise activities discussed: Current Exercise Habits: Home exercise routine, Type of exercise: walking, Time (Minutes): 60, Frequency (Times/Week): 4, Weekly Exercise (Minutes/Week): 240  Goals    . Patient Stated     None at this time      Depression Screen PHQ 2/9 Scores 01/17/2020 09/30/2019 12/02/2016  PHQ - 2 Score 0 0 0    Fall Risk Fall Risk  01/17/2020  Falls in the past year? 0  Number falls in past yr: 0  Injury with Fall? 0  Risk for fall due to : Impaired vision;Impaired mobility  Follow up Falls prevention discussed    FALL RISK PREVENTION PERTAINING TO THE HOME:  Any stairs in or around the home? No  If so, are there any without handrails? No  Home free of loose throw rugs in walkways, pet beds, electrical cords, etc? Yes  Adequate lighting in your home to reduce risk of falls? Yes   ASSISTIVE DEVICES UTILIZED TO PREVENT FALLS:  Life alert? No  Use of a cane, walker or w/c? No  Grab bars in the bathroom? No  Shower chair or bench in shower? Yes  Elevated toilet seat or a handicapped toilet? Yes   TIMED UP AND GO:  Was the test performed? No .     Cognitive Function:     6CIT Screen 01/17/2020  What Year? 0 points  What month? 0 points  Count back from 20 0 points  Months in reverse 0 points  Repeat phrase 0 points    Immunizations Immunization  History  Administered Date(s) Administered  . Influenza Whole 01/23/2007  . Td 03/28/2003    No vaccines related to [place]    Screening Tests Health Maintenance  Topic Date Due  . PNA vac Low Risk Adult (1 of 2 - PCV13) 09/29/2020 (Originally 02/10/2018)  . MAMMOGRAM  07/24/2020  . COLONOSCOPY (Pts 45-40yrs Insurance coverage will need to be confirmed)  04/23/2023  . DEXA SCAN  Completed  . Hepatitis C Screening  Addressed  . INFLUENZA VACCINE  Discontinued  . TETANUS/TDAP  Discontinued  . COVID-19 Vaccine  Discontinued    Health Maintenance  There are no preventive care reminders to display for this patient.  Colorectal cancer screening: Type of screening: Colonoscopy. Completed 04/22/13. Repeat every 10 years  Mammogram status: Completed 07/25/18. Repeat every year  Bone Density status: Completed 12/02/16. Results reflect: Bone density results: OSTEOPOROSIS. Repeat every 2 years.   Additional Screening:  Hepatitis C Screening:  Completed 09/10/14  Vision Screening: Recommended annual ophthalmology exams for early detection of glaucoma and other disorders of the eye. Is the patient up to date with their annual eye exam?  Yes  Who is the provider or what is the name of the office in which the patient attends annual eye exams? Dr Blima Ledger   Dental Screening: Recommended annual dental exams for proper oral hygiene  Community Resource Referral / Chronic Care Management: CRR required this visit?  No   CCM required this visit?  No      Plan:     I have personally reviewed and noted the following in the patient's chart:   . Medical and social history . Use of alcohol, tobacco or illicit drugs  . Current medications and supplements . Functional ability and status . Nutritional status . Physical activity . Advanced directives .  List of other physicians . Hospitalizations, surgeries, and ER visits in previous 12 months . Vitals . Screenings to include  cognitive, depression, and falls . Referrals and appointments  In addition, I have reviewed and discussed with patient certain preventive protocols, quality metrics, and best practice recommendations. A written personalized care plan for preventive services as well as general preventive health recommendations were provided to patient.     Willette Brace, LPN   [id number]   Nurse Notes: None

## 2020-01-24 ENCOUNTER — Other Ambulatory Visit: Payer: Medicare HMO

## 2020-01-24 DIAGNOSIS — Z20822 Contact with and (suspected) exposure to covid-19: Secondary | ICD-10-CM | POA: Diagnosis not present

## 2020-01-26 LAB — SARS-COV-2, NAA 2 DAY TAT

## 2020-01-26 LAB — NOVEL CORONAVIRUS, NAA: SARS-CoV-2, NAA: NOT DETECTED

## 2020-01-27 ENCOUNTER — Other Ambulatory Visit: Payer: Medicare HMO

## 2020-01-27 ENCOUNTER — Other Ambulatory Visit: Payer: Self-pay

## 2020-01-27 DIAGNOSIS — Z1152 Encounter for screening for COVID-19: Secondary | ICD-10-CM

## 2020-01-28 ENCOUNTER — Encounter: Payer: Self-pay | Admitting: Family Medicine

## 2020-01-28 LAB — SARS-COV-2, NAA 2 DAY TAT

## 2020-01-28 LAB — NOVEL CORONAVIRUS, NAA: SARS-CoV-2, NAA: NOT DETECTED

## 2020-03-04 DIAGNOSIS — L820 Inflamed seborrheic keratosis: Secondary | ICD-10-CM | POA: Diagnosis not present

## 2020-03-04 DIAGNOSIS — L738 Other specified follicular disorders: Secondary | ICD-10-CM | POA: Diagnosis not present

## 2020-03-04 NOTE — Patient Instructions (Incomplete)
Depression screen [facility] 2/9 01/17/2020 09/30/2019 12/02/2016  Decreased Interest 0 0 0  Down, Depressed, Hopeless 0 0 0  PHQ - 2 Score 0 0 0

## 2020-03-04 NOTE — Progress Notes (Deleted)
  Phone [phone] In person visit   Subjective:   Amy Doyle is a 65 y.o. year old very pleasant female patient who presents for/with See problem oriented charting No chief complaint on file.   This visit occurred during the SARS-CoV-2 public health emergency.  Safety protocols were in place, including screening questions prior to the visit, additional usage of staff PPE, and extensive cleaning of exam room while observing appropriate contact time as indicated for disinfecting solutions.   Past Medical History-  Patient Active Problem List   Diagnosis Date Noted  . Vaccination reaction 09/30/2019  . Bell's palsy 09/05/2013  . Travel advice encounter 09/05/2013  . CHEST PAIN UNSPECIFIED 03/12/2008  . REACTIVE HYPOGLYCEMIA 04/24/2007  . Vitamin B12 deficiency 04/24/2007  . ANEMIA 04/17/2007  . Hyperlipidemia 01/23/2007  . INTERNAL HEMORRHOIDS 01/23/2007  . Osteoporosis 01/23/2007    Medications- reviewed and updated Current Outpatient Medications  Medication Sig Dispense Refill  . Ascorbic Acid (VITAMIN C) 1000 MG tablet Take 1,000 mg by mouth daily.    Marland Kitchen CALCIUM CITRATE PO Take 250 mg by mouth daily.    . cholecalciferol (VITAMIN D) 25 MCG (1000 UNIT) tablet Take 2,000 Units by mouth daily.    . vitamin B-12 (CYANOCOBALAMIN) 1000 MCG tablet Take 1,000 mcg by mouth daily.    . Zinc 25 MG TABS Take by mouth.     No current facility-administered medications for this visit.     Objective:  There were no vitals taken for this visit. Gen: NAD, resting comfortably CV: RRR no murmurs rubs or gallops Lungs: CTAB no crackles, wheeze, rhonchi Abdomen: soft/nontender/nondistended/normal bowel sounds. No rebound or guarding.  Ext: no edema Skin: warm, dry Neuro: grossly normal, moves all extremities  ***    Assessment and Plan  Antibody Testing S:***  A/P: ***     declines awv *** 09/30/19 cpe ***  No problem-specific Assessment & Plan notes found for this  encounter.   Recommended follow up: ***No follow-ups on file. Future Appointments  Date Time Provider [place]  03/05/2020  4:20 PM Marin Olp, MD [facility]  09/29/2020  8:15 AM [facility] LAB [facility]  10/01/2020 10:40 AM Marin Olp, MD [facility]  01/22/2021  1:00 PM [facility] HEALTH COACH [facility]    Lab/Order associations: No diagnosis found.  No orders of the defined types were placed in this encounter.   Time Spent: *** minutes of total time (6:32 PM***- 6:32 PM***) was spent on the date of the encounter performing the following actions: chart review prior to seeing the patient, obtaining history, performing a medically necessary exam, counseling on the treatment plan, placing orders, and documenting in our EHR.   Return precautions advised.  Clyde Lundborg, CMA

## 2020-03-05 ENCOUNTER — Other Ambulatory Visit: Payer: Self-pay

## 2020-03-05 ENCOUNTER — Ambulatory Visit: Payer: Medicare HMO | Admitting: Family Medicine

## 2020-03-05 ENCOUNTER — Other Ambulatory Visit (INDEPENDENT_AMBULATORY_CARE_PROVIDER_SITE_OTHER): Payer: Medicare HMO

## 2020-03-05 DIAGNOSIS — Z1152 Encounter for screening for COVID-19: Secondary | ICD-10-CM

## 2020-03-05 LAB — SARS-COV-2 IGG: SARS-COV-2 IgG: 0.2

## 2020-03-24 ENCOUNTER — Ambulatory Visit: Payer: Medicare HMO | Admitting: Family Medicine

## 2020-09-03 DIAGNOSIS — Z01419 Encounter for gynecological examination (general) (routine) without abnormal findings: Secondary | ICD-10-CM | POA: Diagnosis not present

## 2020-09-22 DIAGNOSIS — I8391 Asymptomatic varicose veins of right lower extremity: Secondary | ICD-10-CM | POA: Diagnosis not present

## 2020-09-22 DIAGNOSIS — I8392 Asymptomatic varicose veins of left lower extremity: Secondary | ICD-10-CM | POA: Diagnosis not present

## 2020-09-22 DIAGNOSIS — D235 Other benign neoplasm of skin of trunk: Secondary | ICD-10-CM | POA: Diagnosis not present

## 2020-09-22 DIAGNOSIS — D225 Melanocytic nevi of trunk: Secondary | ICD-10-CM | POA: Diagnosis not present

## 2020-09-22 DIAGNOSIS — L821 Other seborrheic keratosis: Secondary | ICD-10-CM | POA: Diagnosis not present

## 2020-09-22 DIAGNOSIS — D2271 Melanocytic nevi of right lower limb, including hip: Secondary | ICD-10-CM | POA: Diagnosis not present

## 2020-09-29 ENCOUNTER — Telehealth: Payer: Self-pay

## 2020-09-29 ENCOUNTER — Other Ambulatory Visit: Payer: Self-pay

## 2020-09-29 ENCOUNTER — Other Ambulatory Visit (INDEPENDENT_AMBULATORY_CARE_PROVIDER_SITE_OTHER): Payer: Medicare HMO

## 2020-09-29 DIAGNOSIS — E785 Hyperlipidemia, unspecified: Secondary | ICD-10-CM

## 2020-09-29 DIAGNOSIS — E538 Deficiency of other specified B group vitamins: Secondary | ICD-10-CM | POA: Diagnosis not present

## 2020-09-29 DIAGNOSIS — M810 Age-related osteoporosis without current pathological fracture: Secondary | ICD-10-CM

## 2020-09-29 LAB — CBC WITH DIFFERENTIAL/PLATELET
Basophils Absolute: 0.1 10*3/uL (ref 0.0–0.1)
Basophils Relative: 1.2 % (ref 0.0–3.0)
Eosinophils Absolute: 0 10*3/uL (ref 0.0–0.7)
Eosinophils Relative: 1.1 % (ref 0.0–5.0)
HCT: 43.7 % (ref 36.0–46.0)
Hemoglobin: 14.4 g/dL (ref 12.0–15.0)
Lymphocytes Relative: 24.7 % (ref 12.0–46.0)
Lymphs Abs: 1 10*3/uL (ref 0.7–4.0)
MCHC: 33 g/dL (ref 30.0–36.0)
MCV: 104.6 fl — ABNORMAL HIGH (ref 78.0–100.0)
Monocytes Absolute: 0.5 10*3/uL (ref 0.1–1.0)
Monocytes Relative: 11.9 % (ref 3.0–12.0)
Neutro Abs: 2.6 10*3/uL (ref 1.4–7.7)
Neutrophils Relative %: 61.1 % (ref 43.0–77.0)
Platelets: 254 10*3/uL (ref 150.0–400.0)
RBC: 4.18 Mil/uL (ref 3.87–5.11)
RDW: 14.2 % (ref 11.5–15.5)
WBC: 4.2 10*3/uL (ref 4.0–10.5)

## 2020-09-29 LAB — VITAMIN D 25 HYDROXY (VIT D DEFICIENCY, FRACTURES): VITD: 40.63 ng/mL (ref 30.00–100.00)

## 2020-09-29 LAB — COMPREHENSIVE METABOLIC PANEL
ALT: 22 U/L (ref 0–35)
AST: 18 U/L (ref 0–37)
Albumin: 4.1 g/dL (ref 3.5–5.2)
Alkaline Phosphatase: 67 U/L (ref 39–117)
BUN: 13 mg/dL (ref 6–23)
CO2: 28 mEq/L (ref 19–32)
Calcium: 9.4 mg/dL (ref 8.4–10.5)
Chloride: 105 mEq/L (ref 96–112)
Creatinine, Ser: 0.66 mg/dL (ref 0.40–1.20)
GFR: 90.63 mL/min (ref >60.00)
Glucose, Bld: 80 mg/dL (ref 70–99)
Potassium: 3.8 mEq/L (ref 3.5–5.1)
Sodium: 141 mEq/L (ref 135–145)
Total Bilirubin: 0.6 mg/dL (ref 0.2–1.2)
Total Protein: 6.9 g/dL (ref 6.0–8.3)

## 2020-09-29 LAB — LIPID PANEL
Cholesterol: 238 mg/dL — ABNORMAL HIGH (ref 0–200)
HDL: 95.9 mg/dL
LDL Cholesterol: 118 mg/dL — ABNORMAL HIGH (ref 0–99)
NonHDL: 141.74
Total CHOL/HDL Ratio: 2
Triglycerides: 118 mg/dL (ref 0.0–149.0)
VLDL: 23.6 mg/dL (ref 0.0–40.0)

## 2020-09-29 LAB — VITAMIN B12: Vitamin B-12: 757 pg/mL (ref 211–911)

## 2020-09-29 NOTE — Telephone Encounter (Signed)
Patient came in the office today to have her blood work drawn for her CPE appointment that she has 10/01/2020. There are no labs placed for the patient but Lenna Sciara did look at the labs that you drew last year at her CPE appointment and she drew those exact labs. Can you place lab orders so that [facility] can sent the blood work off.

## 2020-09-29 NOTE — Telephone Encounter (Signed)
I ordered labs-I did not include an A1c as blood sugar has been normal-we will still check fasting blood sugar.  Also did not do urinalysis-can be done at time of visit-can order a UA under hyperlipidemia if she did in fact drop off a urine

## 2020-10-01 ENCOUNTER — Ambulatory Visit (INDEPENDENT_AMBULATORY_CARE_PROVIDER_SITE_OTHER): Payer: Medicare HMO | Admitting: Family Medicine

## 2020-10-01 ENCOUNTER — Other Ambulatory Visit: Payer: Self-pay

## 2020-10-01 ENCOUNTER — Encounter: Payer: Self-pay | Admitting: Family Medicine

## 2020-10-01 VITALS — BP 123/74 | HR 71 | Temp 98.3°F | Ht 62.0 in | Wt 152.2 lb

## 2020-10-01 DIAGNOSIS — Z0000 Encounter for general adult medical examination without abnormal findings: Secondary | ICD-10-CM | POA: Diagnosis not present

## 2020-10-01 DIAGNOSIS — Z87891 Personal history of nicotine dependence: Secondary | ICD-10-CM | POA: Diagnosis not present

## 2020-10-01 DIAGNOSIS — E538 Deficiency of other specified B group vitamins: Secondary | ICD-10-CM | POA: Diagnosis not present

## 2020-10-01 DIAGNOSIS — M810 Age-related osteoporosis without current pathological fracture: Secondary | ICD-10-CM

## 2020-10-01 DIAGNOSIS — E785 Hyperlipidemia, unspecified: Secondary | ICD-10-CM | POA: Diagnosis not present

## 2020-10-01 LAB — POC URINALSYSI DIPSTICK (AUTOMATED)
Bilirubin, UA: NEGATIVE
Blood, UA: NEGATIVE
Glucose, UA: NEGATIVE
Ketones, UA: NEGATIVE
Leukocytes, UA: NEGATIVE
Nitrite, UA: NEGATIVE
Protein, UA: NEGATIVE
Spec Grav, UA: 1.015 (ref 1.010–1.025)
Urobilinogen, UA: 0.2 E.U./dL
pH, UA: 6 (ref 5.0–8.0)

## 2020-10-01 NOTE — Progress Notes (Signed)
Phone [phone]   Subjective:  Patient presents today for their annual physical. Chief complaint-noted.   See problem oriented charting- ROS- full  review of systems was completed and negative except for: back pain- if sleeps on her side wakes up with significant pain- does ok sleeping on back. New mattress and pillow between legs not helping. Mild issue as day goes on- not bad enough wants to do anything about it   The following were reviewed and entered/updated in epic: Past Medical History:  Diagnosis Date   Anemia    Diverticulosis    Gallstones    Headache(784.0)    Hyperlipidemia    Osteoporosis    Patient Active Problem List   Diagnosis Date Noted   Vaccination reaction 09/30/2019    Priority: 1.   Vitamin B12 deficiency 04/24/2007    Priority: 2.   Hyperlipidemia 01/23/2007    Priority: 2.   Osteoporosis 01/23/2007    Priority: 2.   Bell's palsy 09/05/2013    Priority: 3.   Travel advice encounter 09/05/2013    Priority: 3.   CHEST PAIN UNSPECIFIED 03/12/2008    Priority: 3.   REACTIVE HYPOGLYCEMIA 04/24/2007    Priority: 3.   ANEMIA 04/17/2007    Priority: 3.   INTERNAL HEMORRHOIDS 01/23/2007    Priority: 3.   Past Surgical History:  Procedure Laterality Date   CHOLECYSTECTOMY  03/10/2011   matuateratoma     OOPHORECTOMY     right   TONSILLECTOMY     TUBAL LIGATION      Family History  Problem Relation Age of Onset   Hypertension Mother    Diabetes Father    Heart disease Father        MI 40   Heart disease Brother        MI 60   Bladder Cancer Brother        61, smoker   COPD Brother    Heart disease Brother        around mid 86s   Cancer Paternal Grandmother        breast   Colon cancer Neg Hx    Colon polyps Neg Hx    Rectal cancer Neg Hx    Stomach cancer Neg Hx     Medications- reviewed and updated Current Outpatient Medications  Medication Sig Dispense Refill   Ascorbic Acid (VITAMIN C) 1000 MG tablet Take 1,000 mg by  mouth daily.     CALCIUM CITRATE PO Take 250 mg by mouth daily.     cholecalciferol (VITAMIN D) 25 MCG (1000 UNIT) tablet Take 2,000 Units by mouth daily.     vitamin B-12 (CYANOCOBALAMIN) 1000 MCG tablet Take 1,000 mcg by mouth daily.     Zinc 20 MG CAPS Take by mouth.     No current facility-administered medications for this visit.    Allergies-reviewed and updated Allergies  Allergen Reactions   Aspirin     REACTION: rash   Haemophilus Influenzae Vaccines     Bells palsy 10 days afterwards- concern for recurrence with other vaccines and declines    Social History   Social History Narrative   Married 8 years. 1 kid former marriage. 3 greatgrandkids.       Retired from [employer]: walking, riding bikes, reading, cooking         Objective  Objective:  BP 123/74   Pulse 71   Temp 98.3 F (36.8 C) (Temporal)   Ht 5\' 2"  (1.575  m)   Wt 152 lb 3.2 oz (69 kg)   SpO2 97%   BMI 27.84 kg/m  Gen: NAD, resting comfortably HEENT: Mucous membranes are moist. Oropharynx normal Neck: no thyromegaly CV: RRR no murmurs rubs or gallops Lungs: CTAB no crackles, wheeze, rhonchi Abdomen: soft/nontender/nondistended/normal bowel sounds. No rebound or guarding.  Ext: no edema Skin: warm, dry Neuro: grossly normal, moves all extremities, PERRLA   Assessment and Plan   65 y.o. female presenting for annual physical.  Health Maintenance counseling: 1. Anticipatory guidance: Patient counseled regarding regular dental exams -q6 months-, eye exams -yearly,  avoiding smoking and second hand smoke , limiting alcohol to 1 beverage per day , no illicit drugs .   2. Risk factor reduction:  Advised patient of need for regular exercise and diet rich and fruits and vegetables to reduce risk of heart attack and stroke. Exercise- not back at gym yet- back to walking with husband Maudie Mercury. Diet-reasonably healthy diet/habits. Weight is up 8 lbs lbs  from last physical. Discussed could do calorie  counting- to reverse trend Wt Readings from Last 3 Encounters:  10/01/20 152 lb 3.2 oz (69 kg)  09/30/19 144 lb 6.4 oz (65.5 kg)  04/18/17 146 lb 3.2 oz (66.3 kg)  3. Immunizations/screenings/ancillary studies- discussed Shingrix-declines along with all other immunizations including COVID- Flu shot in the past and had Bell's palsy after- otherwise up-to-date. -did have covid in january Immunization History  Administered Date(s) Administered   Influenza Whole 01/23/2007   Td 03/28/2003  4. Cervical cancer screening-  follows with Dr. Philis Pique- shared decision making and going every 2 years for pap smear though past formal age based screening recommendations 5. Breast cancer screening-  breast exam through GYN and mammogram - scheduled oct 28 with GYN office 6. Colon cancer screening - last done 04/22/2013 with a 10-year repeat planned 7. Skin cancer screening- follows with Dr. Martin Majestic as needed for regular dermatology- mole check within 2-3 weeks.advised regular sunscreen use. Denies worrisome, changing, or new skin lesions.  8. Birth control/STD check- postmenopausal/monogamous 9. Osteoporosis screening at 62- last DEXA 12/02/2016- declines repeat as would not take med 10. Smoking associated screening - former smoker - quit in 2003 and under 20 pack. Get UA today- brother diagnosed with bladder cancer in February  Status of chronic or acute concerns   #hyperlipidemia S: Medication: none.  Lab Results  Component Value Date   CHOL 238 (H) 09/29/2020   HDL 95.90 09/29/2020   LDLCALC 118 (H) 09/29/2020   LDLDIRECT 104.1 02/27/2012   TRIG 118.0 09/29/2020   CHOLHDL 2 09/29/2020  A/P: 10-year ASCVD risk only 5.7%-discussed recent guideline changes were you can consider between 5 and 7.5% doing further evaluation or starting statin-she leans away from statin   -not interested in vegan diet- but would improve plant based diet portoin  # B12 deficiency S: Current treatment/medication (oral  vs. IM): 1000 mcg a day Lab Results  Component Value Date   VITAMINB12 757 09/29/2020  A/P: Controlled. Continue current medications.     # Osteoporosis S: Last DEXA: 12/02/2016  Medication (bisphosphonate or prolia): Has opted out  Calcium: 1200mg  (through diet ok) recommended -only 250 mg calcium citrate plus some through diet Vitamin D: 1000 units a day recommended in summer and 2000 in winter - Encouraged patient to restart walking last year  Last vitamin D Lab Results  Component Value Date   VD25OH 40.63 09/29/2020   A/P:  She stated last year that she would not  take medicine like fosamax even if levels worsened and opted out of dexa - re-discuss today- same plan   Recommended follow up: No follow-ups on file. Future Appointments  Date Time Provider [place]  01/22/2021  1:00 PM [facility] HEALTH COACH [facility]   Lab/Order associations: Not fasting-already had labs   ICD-10-CM   1. Preventative health care  Z00.00     2. Age-related osteoporosis without current pathological fracture  M81.0     3. Hyperlipidemia, unspecified hyperlipidemia type  E78.5 CT CARDIAC SCORING (SELF PAY ONLY)    4. Vitamin B12 deficiency  E53.8     5. Former smoker  Z87.891 POCT Urinalysis Dipstick (Automated)      I,Harris Phan,acting as a Education administrator for Garret Reddish, MD.,have documented all relevant documentation on the behalf of Garret Reddish, MD,as directed by  Garret Reddish, MD while in the presence of Garret Reddish, MD.   I, Garret Reddish, MD, have reviewed all documentation for this visit. The documentation on 10/01/20 for the exam, diagnosis, procedures, and orders are all accurate and complete.   Return precautions advised.  Garret Reddish, MD

## 2020-10-01 NOTE — Addendum Note (Signed)
Addended by: Loura Back on: 10/01/2020 01:23 PM   Modules accepted: Orders

## 2020-10-01 NOTE — Patient Instructions (Addendum)
Health Maintenance Due  Topic Date Due   MAMMOGRAM   - Patient has this scheduled for 10/30/2020 - please have them send [place] a copy.  - Sign release of information at the check out desk for last mammogram from 2021 - last one we have available is from 2020   07/24/2020   Please stop by lab just for urine before you go If you have mychart- we will send your results within 3 business days of [place] receiving them.  If you do not have mychart- we will call you about results within 5 business days of [place] receiving them.  *please also note that you will see labs on mychart as soon as they post. I will later go in and write notes on them- will say "notes from Dr. Yong Channel"  We will consider a pathologist smear review in the future  For your weight, you could try calorie counting or tracking you caloric intake through the "MyFitnessPal" app. I also suggest trying to get at least 150 minutes of exercise per week.  We will call you within two weeks about your referral to Coronary Artery Calcium Scoring. If you do not hear within 2 weeks, give [place] a call.   Recommended follow up: Return in about 1 year (around 10/01/2021) for a physical or sooner if needed.

## 2020-10-08 ENCOUNTER — Ambulatory Visit (INDEPENDENT_AMBULATORY_CARE_PROVIDER_SITE_OTHER)
Admission: RE | Admit: 2020-10-08 | Discharge: 2020-10-08 | Disposition: A | Discharge status: Home / Self Care | Payer: Self-pay | Source: Ambulatory Visit | Attending: Family Medicine | Admitting: Family Medicine

## 2020-10-08 ENCOUNTER — Encounter: Payer: Self-pay | Admitting: Family Medicine

## 2020-10-08 ENCOUNTER — Other Ambulatory Visit: Payer: Self-pay

## 2020-10-08 DIAGNOSIS — E785 Hyperlipidemia, unspecified: Secondary | ICD-10-CM

## 2020-10-08 DIAGNOSIS — I700 Atherosclerosis of aorta: Secondary | ICD-10-CM | POA: Insufficient documentation

## 2020-10-09 ENCOUNTER — Encounter: Payer: Self-pay | Admitting: Family Medicine

## 2020-10-30 DIAGNOSIS — Z1231 Encounter for screening mammogram for malignant neoplasm of breast: Secondary | ICD-10-CM | POA: Diagnosis not present

## 2021-01-14 DIAGNOSIS — H2513 Age-related nuclear cataract, bilateral: Secondary | ICD-10-CM | POA: Diagnosis not present

## 2021-01-14 DIAGNOSIS — H5203 Hypermetropia, bilateral: Secondary | ICD-10-CM | POA: Diagnosis not present

## 2021-01-22 ENCOUNTER — Ambulatory Visit: Payer: Medicare HMO

## 2021-07-14 ENCOUNTER — Encounter: Payer: Self-pay | Admitting: Family Medicine

## 2021-07-14 ENCOUNTER — Ambulatory Visit (INDEPENDENT_AMBULATORY_CARE_PROVIDER_SITE_OTHER): Payer: Medicare HMO | Admitting: Family Medicine

## 2021-07-14 VITALS — BP 138/70 | HR 80 | Temp 98.1°F | Ht 62.0 in | Wt 150.0 lb

## 2021-07-14 DIAGNOSIS — B029 Zoster without complications: Secondary | ICD-10-CM | POA: Diagnosis not present

## 2021-07-14 MED ORDER — VALACYCLOVIR HCL 1 G PO TABS: 1000.0000 mg | ORAL_TABLET | Freq: Three times a day (TID) | ORAL | 0 refills | Status: DC

## 2021-07-14 MED ORDER — HYDROCODONE-ACETAMINOPHEN 5-325 MG PO TABS: 1.0000 | ORAL_TABLET | Freq: Four times a day (QID) | ORAL | 0 refills | Status: DC | PRN

## 2021-07-14 NOTE — Progress Notes (Signed)
Subjective  CC:  Chief Complaint  Patient presents with   Rash    Pt stated that she noticed a rash in the middle of her back and Lt side of hip and is painful.     HPI: Amy Doyle is a 66 y.o. female who presents to the office today to address the problems listed above in the chief complaint. Started with low back soreness 4 days ago. Thought sore from working in yard, however, now with rash on back and new rash on left lower abdomen. Very painful. Hasn't slept for 4 nights due to pain. No f/c/s. Hasn't taken any medications for pain. Not vaccinated for shingles   Assessment  1. Herpes zoster without complication      Plan  shingles:  valtrex 1gm tid and norco for pain. Education given. Skin care discussed.  Follow up: prn  Visit date not found  No orders of the defined types were placed in this encounter.  Meds ordered this encounter  Medications   valACYclovir (VALTREX) 1000 MG tablet    Sig: Take 1 tablet (1,000 mg total) by mouth 3 (three) times daily.    Dispense:  21 tablet    Refill:  0   HYDROcodone-acetaminophen (NORCO) 5-325 MG tablet    Sig: Take 1 tablet by mouth every 6 (six) hours as needed for moderate pain.    Dispense:  20 tablet    Refill:  0      I reviewed the patients updated PMH, FH, and SocHx.    Patient Active Problem List   Diagnosis Date Noted   Aortic atherosclerosis ([place]) 10/08/2020   Vaccination reaction 09/30/2019   Bell's palsy 09/05/2013   Travel advice encounter 09/05/2013   CHEST PAIN UNSPECIFIED 03/12/2008   REACTIVE HYPOGLYCEMIA 04/24/2007   Vitamin B12 deficiency 04/24/2007   ANEMIA 04/17/2007   Hyperlipidemia 01/23/2007   INTERNAL HEMORRHOIDS 01/23/2007   Osteoporosis 01/23/2007   Current Meds  Medication Sig   HYDROcodone-acetaminophen (NORCO) 5-325 MG tablet Take 1 tablet by mouth every 6 (six) hours as needed for moderate pain.   valACYclovir (VALTREX) 1000 MG tablet Take 1 tablet (1,000 mg total) by mouth 3  (three) times daily.    Allergies: Patient is allergic to aspirin and haemophilus influenzae vaccines. Family History: Patient family history includes Bladder Cancer in her brother; COPD in her brother; Cancer in her paternal grandmother; Diabetes in her father; Heart disease in her brother, brother, and father; Hypertension in her mother. Social History:  Patient  reports that she quit smoking about 20 years ago. Her smoking use included cigarettes. She has a 18.75 pack-year smoking history. She has never used smokeless tobacco. She reports current alcohol use. She reports that she does not use drugs.  Review of Systems: Constitutional: Negative for fever malaise or anorexia Cardiovascular: negative for chest pain Respiratory: negative for SOB or persistent cough Gastrointestinal: negative for abdominal pain  Objective  Vitals: BP 138/70   Pulse 80   Temp 98.1 F (36.7 C)   Ht '5\' 2"'$  (1.575 m)   Wt 150 lb (68 kg)   SpO2 98%   BMI 27.44 kg/m  General: no acute distress , A&Ox3  Skin:  Warm, left lower back with classic vesicular rash on erythematous base with anterior new lesion in dermatomal pattern    Commons side effects, risks, benefits, and alternatives for medications and treatment plan prescribed today were discussed, and the patient expressed understanding of the given instructions. Patient is instructed to  call or message via MyChart if he/she has any questions or concerns regarding our treatment plan. No barriers to understanding were identified. We discussed Red Flag symptoms and signs in detail. Patient expressed understanding regarding what to do in case of urgent or emergency type symptoms.  Medication list was reconciled, printed and provided to the patient in AVS. Patient instructions and summary information was reviewed with the patient as documented in the AVS. This note was prepared with assistance of Dragon voice recognition software. Occasional wrong-word or  sound-a-like substitutions may have occurred due to the inherent limitations of voice recognition software  This visit occurred during the SARS-CoV-2 public health emergency.  Safety protocols were in place, including screening questions prior to the visit, additional usage of staff PPE, and extensive cleaning of exam room while observing appropriate contact time as indicated for disinfecting solutions.

## 2021-07-14 NOTE — Patient Instructions (Signed)
Please follow up if symptoms do not improve or as needed.    You may use Capsaicin cream on the rash to help with the pain.   Shingles  Shingles, which is also known as herpes zoster, is an infection that causes a painful skin rash and fluid-filled blisters. It is caused by a virus. Shingles only develops in people who: Have had chickenpox. Have been vaccinated against chickenpox. Shingles is rare in this group. What are the causes? Shingles is caused by varicella-zoster virus. This is the same virus that causes chickenpox. After a person is exposed to the virus, it stays in the body in an inactive (dormant) state. Shingles develops if the virus is reactivated. This can happen many years after the first (initial) exposure to the virus. It is not known what causes this virus to be reactivated. What increases the risk? People who have had chickenpox or received the chickenpox vaccine are at risk for shingles. Shingles infection is more common in people who: Are older than 68 years of age. Have a weakened disease-fighting system (immune system), such as people with: HIV (human immunodeficiency virus). AIDS (acquired immunodeficiency syndrome). Cancer. Are taking medicines that weaken the immune system, such as organ transplant medicines. Are experiencing a lot of stress. What are the signs or symptoms? Early symptoms of this condition include itching, tingling, and pain in an area on your skin. Pain may be described as burning, stabbing, or throbbing. A few days or weeks after early symptoms start, a painful red rash appears. The rash is usually on one side of the body and has a band-like or belt-like pattern. The rash eventually turns into fluid-filled blisters that break open, change into scabs, and dry up in about 2-3 weeks. At any time during the infection, you may also develop: A fever. Chills. A headache. Nausea. How is this diagnosed? This condition is diagnosed with a skin exam.  Skin or fluid samples (a culture) may be taken from the blisters before a diagnosis is made. How is this treated? The rash may last for several weeks. There is not a specific cure for this condition. Your health care provider may prescribe medicines to help you manage pain, recover more quickly, and avoid long-term problems. Medicines may include: Antiviral medicines. Anti-inflammatory medicines. Pain medicines. Anti-itching medicines (antihistamines). If the area involved is on your face, you may be referred to a specialist, such as an eye doctor (ophthalmologist) or an ear, nose, and throat (ENT) doctor (otorhinolaryngologist) to help you avoid eye problems, chronic pain, or disability. Follow these instructions at home: Medicines Take over-the-counter and prescription medicines only as told by your health care provider. Apply an anti-itch cream or numbing cream to the affected area as told by your health care provider. Relieving itching and discomfort  Apply cold, wet cloths (cold compresses) to the area of the rash or blisters as told by your health care provider. Cool baths can be soothing. Try adding baking soda or dry oatmeal to the water to reduce itching. Do not bathe in hot water. Use calamine lotion as recommended by your health care provider. This is an over-the-counter lotion that helps to relieve itchiness. Blister and rash care Keep your rash covered with a loose bandage (dressing). Wear loose-fitting clothing to help ease the pain of material rubbing against the rash. Wash your hands with soap and water for at least 20 seconds before and after you change your dressing. If soap and water are not available, use hand sanitizer. Change  your dressing as told by your health care provider. Keep your rash and blisters clean by washing the area with mild soap and cool water as told by your health care provider. Check your rash every day for signs of infection. Check for: More redness,  swelling, or pain. Fluid or blood. Warmth. Pus or a bad smell. Do not scratch your rash or pick at your blisters. To help avoid scratching: Keep your fingernails clean and cut short. Wear gloves or mittens while you sleep, if scratching is a problem. General instructions Rest as told by your health care provider. Wash your hands often with soap and water for at least 20 seconds. If soap and water are not available, use hand sanitizer. Doing this lowers your chance of getting a bacterial skin infection. Before your blisters change into scabs, your shingles infection can cause chickenpox in people who have never had it or have never been vaccinated against it. To prevent this from happening, avoid contact with other people, especially: Babies. Pregnant women. Children who have eczema. Older people who have transplants. People who have chronic illnesses, such as cancer or AIDS. Keep all follow-up visits. This is important. How is this prevented? Getting vaccinated is the best way to prevent shingles and protect against shingles complications. If you have not been vaccinated, talk with your health care provider about getting the vaccine. Where to find more information Centers for Disease Control and Prevention: http://www.wolf.info/ Contact a health care provider if: Your pain is not relieved with prescribed medicines. Your pain does not get better after the rash heals. You have any of these signs of infection: More redness, swelling, or pain around the rash. Fluid or blood coming from the rash. Warmth coming from your rash. Pus or a bad smell coming from the rash. A fever. Get help right away if: The rash is on your face or nose. You have facial pain, pain around your eye area, or loss of feeling on one side of your face. You have difficulty seeing. You have ear pain or have ringing in your ear. You have a loss of taste. Your condition gets worse. Summary Shingles, also known as herpes  zoster, is an infection that causes a painful skin rash and fluid-filled blisters. This condition is diagnosed with a skin exam. Skin or fluid samples (a culture) may be taken from the blisters. Keep your rash covered with a loose bandage (dressing). Wear loose-fitting clothing to help ease the pain of material rubbing against the rash. Before your blisters change into scabs, your shingles infection can cause chickenpox in people who have never had it or have never been vaccinated against it. This information is not intended to replace advice given to you by your health care provider. Make sure you discuss any questions you have with your health care provider. Document Revised: 12/16/2019 Document Reviewed: 12/16/2019 Elsevier Patient Education  [place].

## 2021-07-19 ENCOUNTER — Other Ambulatory Visit: Payer: Self-pay | Admitting: Family Medicine

## 2021-07-19 MED ORDER — HYDROCODONE-ACETAMINOPHEN 5-325 MG PO TABS: 1.0000 | ORAL_TABLET | Freq: Four times a day (QID) | ORAL | 0 refills | Status: DC | PRN

## 2021-07-28 ENCOUNTER — Other Ambulatory Visit: Payer: Self-pay | Admitting: Family Medicine

## 2021-07-28 ENCOUNTER — Encounter: Payer: Self-pay | Admitting: Family Medicine

## 2021-07-28 MED ORDER — HYDROCODONE-ACETAMINOPHEN 5-325 MG PO TABS: 1.0000 | ORAL_TABLET | Freq: Four times a day (QID) | ORAL | 0 refills | Status: DC | PRN

## 2021-07-28 NOTE — Telephone Encounter (Addendum)
LAST APPOINTMENT DATE: 07/19/2021   NEXT APPOINTMENT DATE: Visit date not found    LAST REFILL: 07/19/2021

## 2021-08-09 DIAGNOSIS — Z0100 Encounter for examination of eyes and vision without abnormal findings: Secondary | ICD-10-CM | POA: Diagnosis not present

## 2021-09-01 ENCOUNTER — Telehealth: Payer: Self-pay | Admitting: Family Medicine

## 2021-09-01 NOTE — Telephone Encounter (Signed)
Pt would like to schedule her labs before her physical on 03/01/22. Please advise

## 2021-09-02 ENCOUNTER — Other Ambulatory Visit: Payer: Self-pay

## 2021-09-02 DIAGNOSIS — Z0000 Encounter for general adult medical examination without abnormal findings: Secondary | ICD-10-CM

## 2021-09-02 DIAGNOSIS — E785 Hyperlipidemia, unspecified: Secondary | ICD-10-CM

## 2021-09-02 DIAGNOSIS — M810 Age-related osteoporosis without current pathological fracture: Secondary | ICD-10-CM

## 2021-09-02 DIAGNOSIS — E538 Deficiency of other specified B group vitamins: Secondary | ICD-10-CM

## 2021-09-02 NOTE — Telephone Encounter (Signed)
Ok to schedule lab a few days prior to CPE, future labs ordered.

## 2021-10-26 DIAGNOSIS — L570 Actinic keratosis: Secondary | ICD-10-CM | POA: Diagnosis not present

## 2021-10-26 DIAGNOSIS — L821 Other seborrheic keratosis: Secondary | ICD-10-CM | POA: Diagnosis not present

## 2021-10-26 DIAGNOSIS — D1801 Hemangioma of skin and subcutaneous tissue: Secondary | ICD-10-CM | POA: Diagnosis not present

## 2021-10-26 DIAGNOSIS — L2089 Other atopic dermatitis: Secondary | ICD-10-CM | POA: Diagnosis not present

## 2021-10-26 DIAGNOSIS — D225 Melanocytic nevi of trunk: Secondary | ICD-10-CM | POA: Diagnosis not present

## 2021-10-26 DIAGNOSIS — I788 Other diseases of capillaries: Secondary | ICD-10-CM | POA: Diagnosis not present

## 2021-11-01 DIAGNOSIS — Z1231 Encounter for screening mammogram for malignant neoplasm of breast: Secondary | ICD-10-CM | POA: Diagnosis not present

## 2022-01-24 ENCOUNTER — Telehealth: Payer: Self-pay | Admitting: Family Medicine

## 2022-01-24 NOTE — Telephone Encounter (Signed)
Copied from [place] [id number]. Topic: Medicare AWV >> Jan 24, 2022 10:02 AM Gillis Santa wrote: Reason for CRM: LVM PATIENT TO CALL [phone] [place]

## 2022-02-24 ENCOUNTER — Other Ambulatory Visit (INDEPENDENT_AMBULATORY_CARE_PROVIDER_SITE_OTHER): Payer: Medicare HMO

## 2022-02-24 DIAGNOSIS — E785 Hyperlipidemia, unspecified: Secondary | ICD-10-CM

## 2022-02-24 DIAGNOSIS — M810 Age-related osteoporosis without current pathological fracture: Secondary | ICD-10-CM | POA: Diagnosis not present

## 2022-02-24 DIAGNOSIS — E538 Deficiency of other specified B group vitamins: Secondary | ICD-10-CM

## 2022-02-24 DIAGNOSIS — Z0000 Encounter for general adult medical examination without abnormal findings: Secondary | ICD-10-CM | POA: Diagnosis not present

## 2022-02-24 LAB — COMPREHENSIVE METABOLIC PANEL
ALT: 21 U/L (ref 0–35)
AST: 18 U/L (ref 0–37)
Albumin: 4.2 g/dL (ref 3.5–5.2)
Alkaline Phosphatase: 82 U/L (ref 39–117)
BUN: 18 mg/dL (ref 6–23)
CO2: 29 mEq/L (ref 19–32)
Calcium: 9.7 mg/dL (ref 8.4–10.5)
Chloride: 104 mEq/L (ref 96–112)
Creatinine, Ser: 0.69 mg/dL (ref 0.40–1.20)
GFR: 88.79 mL/min (ref >60.00)
Glucose, Bld: 88 mg/dL (ref 70–99)
Potassium: 4.5 mEq/L (ref 3.5–5.1)
Sodium: 141 mEq/L (ref 135–145)
Total Bilirubin: 0.5 mg/dL (ref 0.2–1.2)
Total Protein: 6.6 g/dL (ref 6.0–8.3)

## 2022-02-24 LAB — CBC WITH DIFFERENTIAL/PLATELET
Basophils Absolute: 0 10*3/uL (ref 0.0–0.1)
Basophils Relative: 0.7 % (ref 0.0–3.0)
Eosinophils Absolute: 0 10*3/uL (ref 0.0–0.7)
Eosinophils Relative: 0.7 % (ref 0.0–5.0)
HCT: 44 % (ref 36.0–46.0)
Hemoglobin: 14.7 g/dL (ref 12.0–15.0)
Lymphocytes Relative: 24.9 % (ref 12.0–46.0)
Lymphs Abs: 1.3 10*3/uL (ref 0.7–4.0)
MCHC: 33.3 g/dL (ref 30.0–36.0)
MCV: 104.6 fl — ABNORMAL HIGH (ref 78.0–100.0)
Monocytes Absolute: 0.7 10*3/uL (ref 0.1–1.0)
Monocytes Relative: 13.6 % — ABNORMAL HIGH (ref 3.0–12.0)
Neutro Abs: 3 10*3/uL (ref 1.4–7.7)
Neutrophils Relative %: 60.1 % (ref 43.0–77.0)
Platelets: 251 10*3/uL (ref 150.0–400.0)
RBC: 4.21 Mil/uL (ref 3.87–5.11)
RDW: 13.9 % (ref 11.5–15.5)
WBC: 5 10*3/uL (ref 4.0–10.5)

## 2022-02-24 LAB — LIPID PANEL
Cholesterol: 258 mg/dL — ABNORMAL HIGH (ref 0–200)
HDL: 100 mg/dL (ref >39.00)
LDL Cholesterol: 138 mg/dL — ABNORMAL HIGH (ref 0–99)
NonHDL: 158.48
Total CHOL/HDL Ratio: 3
Triglycerides: 100 mg/dL (ref 0.0–149.0)
VLDL: 20 mg/dL (ref 0.0–40.0)

## 2022-02-24 LAB — VITAMIN B12: Vitamin B-12: 546 pg/mL (ref 211–911)

## 2022-02-24 LAB — VITAMIN D 25 HYDROXY (VIT D DEFICIENCY, FRACTURES): VITD: 36.14 ng/mL (ref 30.00–100.00)

## 2022-03-01 ENCOUNTER — Encounter: Payer: Self-pay | Admitting: Family Medicine

## 2022-03-01 ENCOUNTER — Ambulatory Visit (INDEPENDENT_AMBULATORY_CARE_PROVIDER_SITE_OTHER): Payer: Medicare HMO | Admitting: Family Medicine

## 2022-03-01 VITALS — BP 118/84 | HR 78 | Temp 98.0°F | Ht 62.0 in | Wt 138.6 lb

## 2022-03-01 DIAGNOSIS — Z87891 Personal history of nicotine dependence: Secondary | ICD-10-CM

## 2022-03-01 DIAGNOSIS — I700 Atherosclerosis of aorta: Secondary | ICD-10-CM | POA: Diagnosis not present

## 2022-03-01 DIAGNOSIS — E785 Hyperlipidemia, unspecified: Secondary | ICD-10-CM

## 2022-03-01 DIAGNOSIS — Z0000 Encounter for general adult medical examination without abnormal findings: Secondary | ICD-10-CM

## 2022-03-01 LAB — URINALYSIS, ROUTINE W REFLEX MICROSCOPIC
Bilirubin Urine: NEGATIVE
Hgb urine dipstick: NEGATIVE
Leukocytes,Ua: NEGATIVE
Nitrite: NEGATIVE
RBC / HPF: NONE SEEN (ref 0–?)
Specific Gravity, Urine: 1.01 (ref 1.000–1.030)
Total Protein, Urine: NEGATIVE
Urine Glucose: NEGATIVE
Urobilinogen, UA: 0.2 (ref 0.0–1.0)
pH: 6 (ref 5.0–8.0)

## 2022-03-01 NOTE — Progress Notes (Signed)
Phone [phone]   Subjective:  Patient presents today for their annual physical. Chief complaint-noted.   See problem oriented charting- ROS- full  review of systems was completed and negative Per full ROS sheet completed by patient  The following were reviewed and entered/updated in epic: Past Medical History:  Diagnosis Date   Anemia    Diverticulosis    Gallstones    Headache(784.0)    Hyperlipidemia    Osteoporosis    Patient Active Problem List   Diagnosis Date Noted   Vaccination reaction 09/30/2019    Priority: High   Aortic atherosclerosis ([place]) 10/08/2020    Priority: Medium    Vitamin B12 deficiency 04/24/2007    Priority: Medium    Hyperlipidemia 01/23/2007    Priority: Medium    Osteoporosis 01/23/2007    Priority: Medium    Bell's palsy 09/05/2013    Priority: Low   Travel advice encounter 09/05/2013    Priority: Low   CHEST PAIN UNSPECIFIED 03/12/2008    Priority: Low   REACTIVE HYPOGLYCEMIA 04/24/2007    Priority: Low   ANEMIA 04/17/2007    Priority: Low   INTERNAL HEMORRHOIDS 01/23/2007    Priority: Low   Past Surgical History:  Procedure Laterality Date   CHOLECYSTECTOMY  03/10/2011   matuateratoma     OOPHORECTOMY     right   TONSILLECTOMY     TUBAL LIGATION      Family History  Problem Relation Age of Onset   Hypertension Mother    Diabetes Father    Heart disease Father        MI 22   Heart disease Brother        MI 68   Bladder Cancer Brother        25, smoker   COPD Brother    Heart disease Brother        around mid 84s   Cancer Paternal Grandmother        breast   Colon cancer Neg Hx    Colon polyps Neg Hx    Rectal cancer Neg Hx    Stomach cancer Neg Hx     Medications- reviewed and updated Current Outpatient Medications  Medication Sig Dispense Refill   Ascorbic Acid (VITAMIN C) 1000 MG tablet Take 1,000 mg by mouth daily.     CALCIUM CITRATE PO Take 250 mg by mouth daily.     cholecalciferol (VITAMIN D) 25  MCG (1000 UNIT) tablet Take 2,000 Units by mouth daily.     vitamin B-12 (CYANOCOBALAMIN) 1000 MCG tablet Take 1,000 mcg by mouth 4 (four) times a week.     Zinc 20 MG CAPS Take by mouth.     No current facility-administered medications for this visit.    Allergies-reviewed and updated Allergies  Allergen Reactions   Aspirin     REACTION: rash   Haemophilus Influenzae Vaccines     Bells palsy 10 days afterwards- concern for recurrence with other vaccines and declines    Social History   Social History Narrative   Married 8 years. 1 kid former marriage. 3 greatgrandkids.       Retired from [employer]: walking, riding bikes, reading, cooking         Objective  Objective:  BP 118/84   Pulse 78   Temp 98 F (36.7 C)   Ht '5\' 2"'$  (1.575 m)   Wt 138 lb 9.6 oz (62.9 kg)   SpO2 100%   BMI 25.35  kg/m  Gen: NAD, resting comfortably HEENT: Mucous membranes are moist. Oropharynx normal Neck: no thyromegaly CV: RRR no murmurs rubs or gallops Lungs: CTAB no crackles, wheeze, rhonchi Abdomen: soft/nontender/nondistended/normal bowel sounds. No rebound or guarding.  Ext: no edema Skin: warm, dry Neuro: grossly normal, moves all extremities, PERRLA   Assessment and Plan   67 y.o. female presenting for annual physical.  Health Maintenance counseling: 1. Anticipatory guidance: Patient counseled regarding regular dental exams -q6 months, eye exams - yearly,  avoiding smoking and second hand smoke , limiting alcohol to 1 beverage per day- 1 glass of wine every 2-3 weeks, maybe 2 a month , no illicit drugs .   2. Risk factor reduction:  Advised patient of need for regular exercise and diet rich and fruits and vegetables to reduce risk of heart attack and stroke.  Exercise- started back last August in the gym- doing weight bearing exercise- does rows of machines and also does ellipitical.  Diet/weight management-14 lbs down from last cpe- has improved her diet- cut down on  portion size- sweets only once a week  Wt Readings from Last 3 Encounters:  03/01/22 138 lb 9.6 oz (62.9 kg)  07/14/21 150 lb (68 kg)  10/01/20 152 lb 3.2 oz (69 kg)  3. Immunizations/screenings/ancillary studies- had shingles last July- shingrix -declines along with all other immunizations. Had bells palsy after flu shot in past and was traumatic for her. Discussed Tdap if gets cut/scrape Immunization History  Administered Date(s) Administered   Influenza Whole 01/23/2007   Td 03/28/2003  4. Cervical cancer screening-  follows with Dr. Philis Pique- shared decision making and going every 2 years for pap smear though past formal age based screening recommendations 5. Breast cancer screening-  breast exam through GYN and mammogram - scheduled Oct 30 2020 with GYN office per last note- trying to get records plus she reports also had yearly in 2023 6. Colon cancer screening - last done 04/22/2013 with a 10-year repeat planned 7. Skin cancer screening- follows with Dr. Martin Majestic as needed for regular dermatology.advised regular sunscreen use. Denies worrisome, changing, or new skin lesions.  8. Birth control/STD check- postmenopausal/monogamous 9. Osteoporosis screening at 39- last DEXA 12/02/2016- declines repeat as would not take med- she wants to consider possibly dexa in 2025 10. Smoking associated screening - former smoker - quit in 2003 and under 20 pack. Get UA today- brother diagnosed with bladder cancer in February  Status of chronic or acute concerns   #macrocytosis but b12 normal- consider folate in future or path smear review  #Vitamin D deficiency S: Medication: 2000 units per day Last vitamin D Lab Results  Component Value Date   VD25OH 36.14 02/24/2022  A/P: stable- continue current medicines    #hyperlipidemia- CT cardiac of 0 on 10/08/20 #aortic atherosclerosis S: Medication:not interested in meds  - cholesterol is up some- has increased cheese intake and wants to pull back The  10-year ASCVD risk score (Arnett DK, et al., 2019) is: 6.9% Lab Results  Component Value Date   CHOL 258 (H) 02/24/2022   HDL 100.00 02/24/2022   LDLCALC 138 (H) 02/24/2022   LDLDIRECT 104.1 02/27/2012   TRIG 100.0 02/24/2022   CHOLHDL 3 02/24/2022   A/P: reassuring ct calcium scoring but balanced with some aortic atherosclerosis- she would prefer to continue to work on diet/exercise- has done phenomenal job- may try to cut cheese some.   # B12 deficiency S: Current treatment/medication (oral vs. IM): 4x a week now  Lab  Results  Component Value Date   [id number] 02/24/2022  A/P: still adequate- continue to monitor   Recommended follow up: Return in about 1 year (around 03/02/2023) for physical or sooner if needed.Schedule b4 you leave.  Lab/Order associations: already had labs fasting   ICD-10-CM   1. Preventative health care  Z00.00     2. Hyperlipidemia, unspecified hyperlipidemia type  E78.5     3. Aortic atherosclerosis (HCC)  I70.0     4. Former smoker  Z87.891 Urinalysis, Routine w reflex microscopic      No orders of the defined types were placed in this encounter.   Return precautions advised.  Garret Reddish, MD

## 2022-03-01 NOTE — Patient Instructions (Addendum)
Sign release of information at the check out desk for MAMMOGRAM.  You are eligible to schedule your annual wellness visit with our nurse specialist Otila Kluver.  Please consider scheduling this before you leave today  Please stop by lab before you go- just for uine If you have mychart- we will send your results within 3 business days of [place] receiving them.  If you do not have mychart- we will call you about results within 5 business days of [place] receiving them.  *please also note that you will see labs on mychart as soon as they post. I will later go in and write notes on them- will say "notes from Dr. Yong Channel"    Recommended follow up: Return in about 1 year (around 03/02/2023) for physical or sooner if needed.Schedule b4 you leave.

## 2022-03-02 ENCOUNTER — Telehealth: Payer: Self-pay | Admitting: Family Medicine

## 2022-03-02 NOTE — Telephone Encounter (Signed)
Patient scheduled next CPE for 03/03/23 and would like to get fasting labs completed prior to that date.  Can we order these labs?

## 2022-03-10 NOTE — Telephone Encounter (Signed)
Usually would recommend calling in 2 to 3 weeks ahead of time-I am not sure if labs would remain valid that far out-usually the longest that I do is 6 months

## 2022-03-11 DIAGNOSIS — H524 Presbyopia: Secondary | ICD-10-CM | POA: Diagnosis not present

## 2022-03-14 NOTE — Telephone Encounter (Signed)
Sent pt message via MyChart

## 2022-04-08 DIAGNOSIS — R69 Illness, unspecified: Secondary | ICD-10-CM | POA: Diagnosis not present

## 2022-04-21 DIAGNOSIS — G43B0 Ophthalmoplegic migraine, not intractable: Secondary | ICD-10-CM | POA: Diagnosis not present

## 2022-04-21 DIAGNOSIS — H5203 Hypermetropia, bilateral: Secondary | ICD-10-CM | POA: Diagnosis not present

## 2022-04-21 DIAGNOSIS — H524 Presbyopia: Secondary | ICD-10-CM | POA: Diagnosis not present

## 2022-04-21 DIAGNOSIS — Z0100 Encounter for examination of eyes and vision without abnormal findings: Secondary | ICD-10-CM | POA: Diagnosis not present

## 2022-04-21 DIAGNOSIS — H52223 Regular astigmatism, bilateral: Secondary | ICD-10-CM | POA: Diagnosis not present

## 2022-04-21 DIAGNOSIS — H53143 Visual discomfort, bilateral: Secondary | ICD-10-CM | POA: Diagnosis not present

## 2022-04-28 ENCOUNTER — Telehealth: Payer: Self-pay | Admitting: Family Medicine

## 2022-04-28 NOTE — Telephone Encounter (Signed)
Copied from CRM [id number]. Topic: Medicare AWV >> Apr 28, 2022  9:38 AM Gwenith Spitz wrote: Reason for CRM: Called patient to schedule Medicare Annual Wellness Visit (AWV). Left message for patient to call back and schedule Medicare Annual Wellness Visit (AWV).  Last date of AWV: 01/17/2020  Please schedule an appointment at any time with Inetta Fermo, [facility]. Please schedule AWVS with Inetta Fermo, [facility].  If any questions, please contact me at [phone].  Thank you ,  Gabriel Cirri [facility] AWV TEAM Direct Dial [phone]

## 2022-04-28 NOTE — Telephone Encounter (Signed)
Contacted Amy Doyle to schedule their annual wellness visit. Patient declined to schedule AWV at this time.  Do not call for AWV. Patient request to be removed from list.  Gabriel Cirri [facility] AWV TEAM Direct Dial [phone]

## 2022-04-30 DIAGNOSIS — R69 Illness, unspecified: Secondary | ICD-10-CM | POA: Diagnosis not present

## 2022-10-28 DIAGNOSIS — L821 Other seborrheic keratosis: Secondary | ICD-10-CM | POA: Diagnosis not present

## 2022-10-28 DIAGNOSIS — I8392 Asymptomatic varicose veins of left lower extremity: Secondary | ICD-10-CM | POA: Diagnosis not present

## 2022-10-28 DIAGNOSIS — D225 Melanocytic nevi of trunk: Secondary | ICD-10-CM | POA: Diagnosis not present

## 2022-10-28 DIAGNOSIS — D2272 Melanocytic nevi of left lower limb, including hip: Secondary | ICD-10-CM | POA: Diagnosis not present

## 2022-10-28 DIAGNOSIS — L820 Inflamed seborrheic keratosis: Secondary | ICD-10-CM | POA: Diagnosis not present

## 2022-10-28 DIAGNOSIS — L570 Actinic keratosis: Secondary | ICD-10-CM | POA: Diagnosis not present

## 2022-10-28 DIAGNOSIS — D2262 Melanocytic nevi of left upper limb, including shoulder: Secondary | ICD-10-CM | POA: Diagnosis not present

## 2022-10-28 DIAGNOSIS — D235 Other benign neoplasm of skin of trunk: Secondary | ICD-10-CM | POA: Diagnosis not present

## 2022-11-03 DIAGNOSIS — Z1231 Encounter for screening mammogram for malignant neoplasm of breast: Secondary | ICD-10-CM | POA: Diagnosis not present

## 2022-11-03 DIAGNOSIS — Z124 Encounter for screening for malignant neoplasm of cervix: Secondary | ICD-10-CM | POA: Diagnosis not present

## 2022-11-03 DIAGNOSIS — Z01419 Encounter for gynecological examination (general) (routine) without abnormal findings: Secondary | ICD-10-CM | POA: Diagnosis not present

## 2022-11-03 LAB — HM PAP SMEAR

## 2022-11-03 LAB — HM MAMMOGRAPHY

## 2022-12-14 DIAGNOSIS — R69 Illness, unspecified: Secondary | ICD-10-CM | POA: Diagnosis not present

## 2023-02-02 ENCOUNTER — Other Ambulatory Visit: Payer: Self-pay

## 2023-02-02 ENCOUNTER — Telehealth: Payer: Self-pay

## 2023-02-02 DIAGNOSIS — E785 Hyperlipidemia, unspecified: Secondary | ICD-10-CM

## 2023-02-02 DIAGNOSIS — E538 Deficiency of other specified B group vitamins: Secondary | ICD-10-CM

## 2023-02-02 DIAGNOSIS — Z0000 Encounter for general adult medical examination without abnormal findings: Secondary | ICD-10-CM

## 2023-02-02 NOTE — Telephone Encounter (Signed)
Lab orders placed, ok to schedule lab visit.  Copied from CRM [id number]. Topic: Clinical - Request for Lab/Test Order >> Feb 01, 2023  3:17 PM Amy Doyle wrote: Reason for CRM: Patient is requesting Dr. Durene Cal to put in her lab orders so that may make a lab appointment for 2/19 prior to her office visit with Dr. Durene Cal on 2/28 - Please call patient back on her home phone when orders are in so she can schedule her lab appointment, patient states if she does not answer clinic can leave a voicemail.

## 2023-02-21 ENCOUNTER — Encounter: Payer: Self-pay | Admitting: Family Medicine

## 2023-02-21 ENCOUNTER — Other Ambulatory Visit (INDEPENDENT_AMBULATORY_CARE_PROVIDER_SITE_OTHER): Payer: Medicare HMO

## 2023-02-21 DIAGNOSIS — Z0000 Encounter for general adult medical examination without abnormal findings: Secondary | ICD-10-CM

## 2023-02-21 DIAGNOSIS — E785 Hyperlipidemia, unspecified: Secondary | ICD-10-CM

## 2023-02-21 DIAGNOSIS — E538 Deficiency of other specified B group vitamins: Secondary | ICD-10-CM

## 2023-02-21 LAB — CBC WITH DIFFERENTIAL/PLATELET
Basophils Absolute: 0 10*3/uL (ref 0.0–0.1)
Basophils Relative: 0.5 % (ref 0.0–3.0)
Eosinophils Absolute: 0 10*3/uL (ref 0.0–0.7)
Eosinophils Relative: 0.8 % (ref 0.0–5.0)
HCT: 44.2 % (ref 36.0–46.0)
Hemoglobin: 14.7 g/dL (ref 12.0–15.0)
Lymphocytes Relative: 24.7 % (ref 12.0–46.0)
Lymphs Abs: 1.2 10*3/uL (ref 0.7–4.0)
MCHC: 33.2 g/dL (ref 30.0–36.0)
MCV: 105.3 fL — ABNORMAL HIGH (ref 78.0–100.0)
Monocytes Absolute: 0.5 10*3/uL (ref 0.1–1.0)
Monocytes Relative: 11.2 % (ref 3.0–12.0)
Neutro Abs: 3 10*3/uL (ref 1.4–7.7)
Neutrophils Relative %: 62.8 % (ref 43.0–77.0)
Platelets: 276 10*3/uL (ref 150.0–400.0)
RBC: 4.2 Mil/uL (ref 3.87–5.11)
RDW: 13.9 % (ref 11.5–15.5)
WBC: 4.8 10*3/uL (ref 4.0–10.5)

## 2023-02-21 LAB — COMPREHENSIVE METABOLIC PANEL
ALT: 24 U/L (ref 0–35)
AST: 22 U/L (ref 0–37)
Albumin: 4.1 g/dL (ref 3.5–5.2)
Alkaline Phosphatase: 73 U/L (ref 39–117)
BUN: 14 mg/dL (ref 6–23)
CO2: 28 meq/L (ref 19–32)
Calcium: 8.9 mg/dL (ref 8.4–10.5)
Chloride: 104 meq/L (ref 96–112)
Creatinine, Ser: 0.69 mg/dL (ref 0.40–1.20)
GFR: 88.17 mL/min (ref >60.00)
Glucose, Bld: 86 mg/dL (ref 70–99)
Potassium: 3.8 meq/L (ref 3.5–5.1)
Sodium: 139 meq/L (ref 135–145)
Total Bilirubin: 0.6 mg/dL (ref 0.2–1.2)
Total Protein: 7 g/dL (ref 6.0–8.3)

## 2023-02-21 LAB — VITAMIN D 25 HYDROXY (VIT D DEFICIENCY, FRACTURES): VITD: 45.93 ng/mL (ref 30.00–100.00)

## 2023-02-21 LAB — LIPID PANEL
Cholesterol: 241 mg/dL — ABNORMAL HIGH (ref 0–200)
HDL: 97.3 mg/dL (ref >39.00)
LDL Cholesterol: 127 mg/dL — ABNORMAL HIGH (ref 0–99)
NonHDL: 143.31
Total CHOL/HDL Ratio: 2
Triglycerides: 84 mg/dL (ref 0.0–149.0)
VLDL: 16.8 mg/dL (ref 0.0–40.0)

## 2023-02-21 LAB — VITAMIN B12: Vitamin B-12: 598 pg/mL (ref 211–911)

## 2023-02-22 ENCOUNTER — Other Ambulatory Visit: Payer: Medicare HMO

## 2023-03-03 ENCOUNTER — Encounter: Payer: Medicare HMO | Admitting: Family Medicine

## 2023-03-10 ENCOUNTER — Ambulatory Visit (INDEPENDENT_AMBULATORY_CARE_PROVIDER_SITE_OTHER): Payer: Medicare HMO | Admitting: Family Medicine

## 2023-03-10 ENCOUNTER — Encounter: Payer: Self-pay | Admitting: Family Medicine

## 2023-03-10 VITALS — BP 130/74 | HR 79 | Temp 97.9°F | Ht 62.0 in | Wt 143.8 lb

## 2023-03-10 DIAGNOSIS — Z1211 Encounter for screening for malignant neoplasm of colon: Secondary | ICD-10-CM | POA: Diagnosis not present

## 2023-03-10 DIAGNOSIS — Z131 Encounter for screening for diabetes mellitus: Secondary | ICD-10-CM | POA: Diagnosis not present

## 2023-03-10 DIAGNOSIS — E785 Hyperlipidemia, unspecified: Secondary | ICD-10-CM | POA: Diagnosis not present

## 2023-03-10 DIAGNOSIS — E663 Overweight: Secondary | ICD-10-CM

## 2023-03-10 DIAGNOSIS — E538 Deficiency of other specified B group vitamins: Secondary | ICD-10-CM | POA: Diagnosis not present

## 2023-03-10 DIAGNOSIS — I700 Atherosclerosis of aorta: Secondary | ICD-10-CM

## 2023-03-10 DIAGNOSIS — Z0000 Encounter for general adult medical examination without abnormal findings: Secondary | ICD-10-CM | POA: Diagnosis not present

## 2023-03-10 NOTE — Progress Notes (Signed)
 Phone [phone]   Subjective:  Patient presents today for their annual physical. Chief complaint-noted.   See problem oriented charting- ROS- full  review of systems was completed and negative except for: chest tightness at night for 5 minutes at times- occasionally more mild in day  The following were reviewed and entered/updated in epic: Past Medical History:  Diagnosis Date   Anemia    Diverticulosis    Gallstones    Headache(784.0)    Hyperlipidemia    Osteoporosis    Patient Active Problem List   Diagnosis Date Noted   Vaccination reaction 09/30/2019    Priority: High   Aortic atherosclerosis (HCC) 10/08/2020    Priority: Medium    Vitamin B12 deficiency 04/24/2007    Priority: Medium    Hyperlipidemia 01/23/2007    Priority: Medium    Osteoporosis 01/23/2007    Priority: Medium    Bell's palsy 09/05/2013    Priority: Low   Travel advice encounter 09/05/2013    Priority: Low   CHEST PAIN UNSPECIFIED 03/12/2008    Priority: Low   REACTIVE HYPOGLYCEMIA 04/24/2007    Priority: Low   ANEMIA 04/17/2007    Priority: Low   INTERNAL HEMORRHOIDS 01/23/2007    Priority: Low   Past Surgical History:  Procedure Laterality Date   CHOLECYSTECTOMY  03/10/2011   matuateratoma     OOPHORECTOMY     right   TONSILLECTOMY     TUBAL LIGATION      Family History  Problem Relation Age of Onset   Hypertension Mother    Diabetes Father    Heart disease Father        MI 37   Heart disease Brother        MI 49   Bladder Cancer Brother        53, smoker   COPD Brother    Heart disease Brother        around mid 73s   Alcoholism Brother    Cancer Paternal Grandmother        breast   Colon cancer Neg Hx    Colon polyps Neg Hx    Rectal cancer Neg Hx    Stomach cancer Neg Hx     Medications- reviewed and updated Current Outpatient Medications  Medication Sig Dispense Refill   Ascorbic Acid (VITAMIN C) 1000 MG tablet Take 1,000 mg by mouth daily.     CALCIUM  CITRATE PO Take 250 mg by mouth daily.     cholecalciferol (VITAMIN D) 25 MCG (1000 UNIT) tablet Take 2,000 Units by mouth daily.     vitamin B-12 (CYANOCOBALAMIN) 1000 MCG tablet Take 1,000 mcg by mouth 4 (four) times a week.     Zinc 20 MG CAPS Take by mouth.     No current facility-administered medications for this visit.    Allergies-reviewed and updated Allergies  Allergen Reactions   Aspirin     REACTION: rash   Haemophilus Influenzae Vaccines     Bells palsy 10 days afterwards- concern for recurrence with other vaccines and declines    Social History   Social History Narrative   Married 8 years. 1 kid former marriage. 3 greatgrandkids.       Retired from [employer]: walking, riding bikes, reading, cooking         Objective  Objective:  BP 130/74   Pulse 79   Temp 97.9 F (36.6 C)   Ht 5\' 2"  (1.575 m)   Wt  143 lb 12.8 oz (65.2 kg)   SpO2 97%   BMI 26.30 kg/m  Gen: NAD, resting comfortably HEENT: Mucous membranes are moist. Oropharynx normal Neck: no thyromegaly CV: RRR no murmurs rubs or gallops Lungs: CTAB no crackles, wheeze, rhonchi Abdomen: soft/nontender/nondistended/normal bowel sounds. No rebound or guarding.  Ext: no edema Skin: warm, dry Neuro: grossly normal, moves all extremities, PERRLA   Assessment and Plan   68 y.o. female presenting for annual physical.  Health Maintenance counseling: 1. Anticipatory guidance: Patient counseled regarding regular dental exams -q6 months, eye exams - Dr. Hyacinth Meeker annually,  avoiding smoking and second hand smoke , limiting alcohol to 1 beverage per day- 3-4 a month , no illicit drugs .   2. Risk factor reduction:  Advised patient of need for regular exercise and diet rich and fruits and vegetables to reduce risk of heart attack and stroke.  Exercise- using private trainer for last 6 weeks- more weights.  Diet/weight management-up 5 pounds in the last year but was down 14 pounds last year and feels  has gained some muscle mass. Shed like to lose about 5 lbs Wt Readings from Last 3 Encounters:  03/10/23 143 lb 12.8 oz (65.2 kg)  03/01/22 138 lb 9.6 oz (62.9 kg)  07/14/21 150 lb (68 kg)  3. Immunizations/screenings/ancillary studies-after developing Bell's palsy with flu shot has discontinued immunizations including tetanus, flu, COVID, Shingrix, Prevnar 20.  Discussed Tdap if gets cut or scrape.  Has had shingles July 2023 believe Immunization History  Administered Date(s) Administered   Influenza Whole 01/23/2007   Td 03/28/2003  4. Cervical cancer screening- still follows with Dr. Henderson Cloud and through shared decision making going every 2 years for Pap smear the past formal age-based screening recommendations 5. Breast cancer screening-  breast exam with GYN when and mammogram 11/03/22 6. Colon cancer screening -normal in 2015 and prefers to do Cologuard at this point with no family or personal history of colon cancer or adenomatous polyps 7. Skin cancer screening-follows [facility] dermatology Dr. Roderic Scarce every 2 years. advised regular sunscreen use. Denies worrisome, changing, or new skin lesions.  8. Birth control/STD check-postmenopausal monogamous 9. Osteoporosis screening at 79- last DEXA 2018-she declines repeat as would not be interested in medication despite known osteopenia 10. Smoking associated screening - former smoker- quit in 2002 or 2003- no regular screening- plan for urinalysis next year- wants to hold off this year- brother with bladder cancer as smoker  Status of chronic or acute concerns   #social update- [place] anniversary trip coming up  #Chest pain S: gets a cramping in mid chest at night 2/10 but had one episode 8/10 but that was one time was on her side and didn't know if chest was cramped- woke her up in december. In daytime occasionally feels tightness- not usually after meals 1/10. Does not feel the pain while working out. No shortness of breath, left arm  or neck pain, dizziness.  -2.5 months of symptoms - not worsening. Usually about 5 minutes. Only -usually twice a week  A/P: chest pain is non exertional- we offered EKG - wants to hold off- could be reflux and is going to try Pepcid before dinner - if fails to improve or worsens she will schedule follow up and we will reconsider this. Do have some reassurance from CT calcium scoring of 0 within 3 years but discussed that does not rule out non calcified plaque so still possibility but baed on symptoms less likely cardiac. New or  worsening symptoms she will seek care immediately.  -definitely open to feedback at 2 weeks- if no better either coming back for EKG or directly refer to cardiology  #hyperlipidemia-CT cardiac scoring of 0 on 10/08/2020 # Aortic atherosclerosis-prefers to remain off statin S: Medication:none Lab Results  Component Value Date   CHOL 241 (H) 02/21/2023   HDL 97.30 02/21/2023   LDLCALC 127 (H) 02/21/2023   LDLDIRECT 104.1 02/27/2012   TRIG 84.0 02/21/2023   CHOLHDL 2 02/21/2023   A/P: some improvement this year and wantst o remain off medicine and continue to work on healthy eating and regular exercise    # Osteoporosis S: Last DEXA: 2018 Medication (bisphosphonate or prolia): would decline  Calcium: 1200mg  (through diet ok) recommended  Vitamin D: 1000 units a day recommended  A/P: osteoporosis but not interested in medicine so declines DEXA- is doing weight bearing exercise and calcium and vitamin D- being strong and balanced can help prevent falls which is big risk factor for fracture   # B12 deficiency S: Current treatment/medication (oral vs. IM): oral b12 1000 mcg 3 times a week Lab Results  Component Value Date   VITAMINB12 598 02/21/2023  A/P: stable- continue current medicines    #Vitamin D deficiency S: Medication: 2000 units/day A/P: stable- continue current medicines    # Macrocytosis-persistent despite replating B12 deficiency  - quest is  discontinuing pathologist smear review- offered hematology consult - wants to hold off unless any decline in overall health but feeling really well -Could check folate and MMA as well   Recommended follow up: No follow-ups on file.  Lab/Order associations:NOT fasting   ICD-10-CM   1. Preventative health care  Z00.00     2. Vitamin B12 deficiency  E53.8     3. Hyperlipidemia, unspecified hyperlipidemia type  E78.5     4. Aortic atherosclerosis (HCC)  I70.0     5. Screening for diabetes mellitus  Z13.1     6. Overweight  E66.3     7. Screen for colon cancer  Z12.11 Cologuard      No orders of the defined types were placed in this encounter.   Return precautions advised.  Tana Conch, MD

## 2023-03-10 NOTE — Patient Instructions (Addendum)
 chest pain is non exertional- we offered EKG - wants to hold off- could be reflux and is going to try Pepcid before dinner - if fails to improve or worsens she will schedule follow up and we will reconsider this. Do have some reassurance from CT calcium scoring of 0 within 3 years but discussed that does not rule out non calcified plaque so still possibility but baed on symptoms less likely cardiac. New or worsening symptoms she will seek care immediately. -definitely open to feedback at 2 weeks- if no better either coming back for EKG or directly refer to cardiology  please let us know if you have not received your Cologuard within 3 weeks-please complete after you receive  Recommended follow up: Return in about 1 year (around 03/09/2024) for physical or sooner if needed.Schedule b4 you leave.

## 2023-03-19 DIAGNOSIS — Z1211 Encounter for screening for malignant neoplasm of colon: Secondary | ICD-10-CM | POA: Diagnosis not present

## 2023-03-23 DIAGNOSIS — H5203 Hypermetropia, bilateral: Secondary | ICD-10-CM | POA: Diagnosis not present

## 2023-03-24 ENCOUNTER — Encounter: Payer: Self-pay | Admitting: Family Medicine

## 2023-03-24 LAB — COLOGUARD: COLOGUARD: NEGATIVE

## 2023-08-30 ENCOUNTER — Ambulatory Visit (INDEPENDENT_AMBULATORY_CARE_PROVIDER_SITE_OTHER): Admitting: Physician Assistant

## 2023-08-30 ENCOUNTER — Encounter: Payer: Self-pay | Admitting: Physician Assistant

## 2023-08-30 ENCOUNTER — Ambulatory Visit (INDEPENDENT_AMBULATORY_CARE_PROVIDER_SITE_OTHER)

## 2023-08-30 VITALS — BP 168/90 | HR 87 | Temp 97.9°F | Ht 62.0 in | Wt 145.8 lb

## 2023-08-30 DIAGNOSIS — I700 Atherosclerosis of aorta: Secondary | ICD-10-CM | POA: Diagnosis not present

## 2023-08-30 DIAGNOSIS — R0989 Other specified symptoms and signs involving the circulatory and respiratory systems: Secondary | ICD-10-CM | POA: Diagnosis not present

## 2023-08-30 DIAGNOSIS — R058 Other specified cough: Secondary | ICD-10-CM

## 2023-08-30 MED ORDER — ALBUTEROL SULFATE HFA 108 (90 BASE) MCG/ACT IN AERS: 2.0000 | INHALATION_SPRAY | Freq: Four times a day (QID) | RESPIRATORY_TRACT | 2 refills | Status: AC | PRN

## 2023-08-30 MED ORDER — AZITHROMYCIN 250 MG PO TABS: ORAL_TABLET | ORAL | 0 refills | Status: AC

## 2023-08-30 MED ORDER — PROMETHAZINE-DM 6.25-15 MG/5ML PO SYRP: 5.0000 mL | ORAL_SOLUTION | Freq: Every evening | ORAL | 0 refills | Status: AC | PRN

## 2023-08-30 NOTE — Progress Notes (Signed)
 Patient ID: Amy Doyle, female    DOB: 08-06-1955, 68 y.o.   MRN: [id number]   Assessment & Plan:  Productive cough -     DG Chest 2 View; Future  Other orders -     Azithromycin ; Take 2 tablets on day 1, then 1 tablet daily on days 2 through 5  Dispense: 6 tablet; Refill: 0 -     Promethazine -DM; Take 5 mLs by mouth at bedtime as needed for cough.  Dispense: 120 mL; Refill: 0 -     Albuterol  Sulfate HFA; Inhale 2 puffs into the lungs every 6 (six) hours as needed for wheezing or shortness of breath.  Dispense: 1 each; Refill: 2      Assessment and Plan Assessment & Plan Acute cough with congestion and shortness of breath Acute cough with congestion and shortness of breath for ten days. Symptoms initially improved with Sudafed but worsened after discontinuation. No fever or chills. No recent smoking history, quit 25 years ago. No known lung issues. Differential includes viral bronchitis and possible pneumonia. Chest sounds are clear, but a chest x-ray is warranted to rule out pneumonia. Antibiotic therapy is considered necessary due to the severity and duration of symptoms. Avoid steroids as there is no wheezing present. - Order chest x-ray to evaluate for pneumonia. - Prescribe azithromycin  (Z-Pak) as initial antibiotic therapy. - Recommend cough suppressant for nighttime use. - Advise use of Mucinex during the day to help break up mucus. - Prescribe a rescue inhaler to open airways and clear mucus.      Return if symptoms worsen or fail to improve.    Subjective:    Chief Complaint  Patient presents with   Cough    With congestion, this is the 10th day. Thought was a little cold, took some sudafed and thought was feeling better but getting worse now, mainly congestion, body aching a little only back top left shoulder area. No fever or body chills, no sore throat.    Cough   Discussed the use of AI scribe software for clinical note transcription with the patient,  who gave verbal consent to proceed.  History of Present Illness Amy Doyle is a 68 year old female who presents with persistent cough and congestion.  Symptoms began ten days ago with congestion and a 'funny' feeling, progressing to significant congestion by the following day. Sudafed was initially effective in reducing cough and improving sleep, but symptoms worsened after discontinuation, with severe coughing and weakness on the worst day.  The cough is severe, causing shortness of breath and weakness, and is described as 'very thick.' Despite maintaining an active lifestyle and a sterile home environment, symptoms persist. No fever or chills have been experienced, and her husband has not fallen ill.  She quit smoking 25 years ago and has no known lung issues. She has not required antibiotics for significant illnesses in recent years, except for shingles a couple of years ago. She does not have seasonal allergies and typically recovers quickly from colds.  In the past four days, she has experienced a sore mouth and bleeding when flossing, which is unusual for her. She attributes this to the ongoing cough and has been using salt water rinses. She stopped flossing for a few days due to feeling unwell.  She has not been taking any medications other than Sudafed, which she discontinued. She has not used any inhalers recently.     Past Medical History:  Diagnosis Date  Anemia    Diverticulosis    Gallstones    Headache(784.0)    Hyperlipidemia    Osteoporosis     Past Surgical History:  Procedure Laterality Date   CHOLECYSTECTOMY  03/10/2011   matuateratoma     OOPHORECTOMY     right   TONSILLECTOMY     TUBAL LIGATION      Family History  Problem Relation Age of Onset   Hypertension Mother    Diabetes Father    Heart disease Father        MI 44   Heart disease Brother        MI 62   Bladder Cancer Brother        74, smoker   COPD Brother    Heart disease Brother         around mid 31s   Alcoholism Brother    Cancer Paternal Grandmother        breast   Colon cancer Neg Hx    Colon polyps Neg Hx    Rectal cancer Neg Hx    Stomach cancer Neg Hx     Social History   Tobacco Use   Smoking status: Former    Current packs/day: 0.00    Average packs/day: 1.3 packs/day for 15.0 years (18.8 ttl pk-yrs)    Types: Cigarettes    Start date: 06/04/1986    Quit date: 06/03/2001    Years since quitting: 22.2   Smokeless tobacco: Never  Substance Use Topics   Alcohol use: Yes    Comment: occasional glass of wine 1x month   Drug use: No     Allergies  Allergen Reactions   Aspirin     REACTION: rash   Haemophilus Influenzae Vaccines     Bells palsy 10 days afterwards- concern for recurrence with other vaccines and declines    Review of Systems  Respiratory:  Positive for cough.    NEGATIVE UNLESS OTHERWISE INDICATED IN HPI      Objective:     BP (!) 168/90   Pulse 87   Temp 97.9 F (36.6 C)   Ht 5' 2 (1.575 m)   Wt 145 lb 12.8 oz (66.1 kg)   SpO2 95%   BMI 26.67 kg/m   Wt Readings from Last 3 Encounters:  08/30/23 145 lb 12.8 oz (66.1 kg)  03/10/23 143 lb 12.8 oz (65.2 kg)  03/01/22 138 lb 9.6 oz (62.9 kg)    BP Readings from Last 3 Encounters:  08/30/23 (!) 168/90  03/10/23 130/74  03/01/22 118/84     Physical Exam Vitals and nursing note reviewed.  Constitutional:      General: She is not in acute distress.    Appearance: Normal appearance. She is not ill-appearing.  HENT:     Head: Normocephalic.     Right Ear: Tympanic membrane, ear canal and external ear normal.     Left Ear: Tympanic membrane, ear canal and external ear normal.     Nose: Congestion present.     Mouth/Throat:     Mouth: Mucous membranes are moist.     Pharynx: No oropharyngeal exudate or posterior oropharyngeal erythema.  Eyes:     Extraocular Movements: Extraocular movements intact.     Conjunctiva/sclera: Conjunctivae normal.     Pupils: Pupils  are equal, round, and reactive to light.  Cardiovascular:     Rate and Rhythm: Normal rate and regular rhythm.     Pulses: Normal pulses.     Heart sounds:  Normal heart sounds. No murmur heard. Pulmonary:     Effort: Pulmonary effort is normal. No respiratory distress.     Breath sounds: Normal breath sounds. No wheezing.     Comments: Thick mucous producing cough in exam room Musculoskeletal:     Cervical back: Normal range of motion.  Skin:    General: Skin is warm.  Neurological:     Mental Status: She is alert and oriented to person, place, and time.  Psychiatric:        Mood and Affect: Mood normal.        Behavior: Behavior normal.             Anhar Mcdermott M Jenne Sellinger, PA-C

## 2023-08-30 NOTE — Patient Instructions (Signed)
  VISIT SUMMARY: You visited us  today due to a persistent cough and congestion that started ten days ago. We discussed your symptoms, including severe coughing, shortness of breath, and recent mouth soreness and bleeding when flossing.  YOUR PLAN: ACUTE COUGH WITH CONGESTION AND SHORTNESS OF BREATH: You have been experiencing a severe cough with congestion and shortness of breath for ten days. Your symptoms initially improved with Sudafed but worsened after you stopped taking it. There is no fever or chills, and you quit smoking 25 years ago with no known lung issues. -We will order a chest x-ray to check for pneumonia. -You will start taking azithromycin  (Z-Pak) as an antibiotic. -Use a cough suppressant at night to help you sleep. -Take Mucinex during the day to help break up mucus. -We are prescribing a rescue inhaler to help open your airways and clear mucus.                       Contains text generated by [employer]bridge.                                 Contains text generated by [employer]bridge.

## 2023-08-31 ENCOUNTER — Ambulatory Visit: Payer: Self-pay | Admitting: Physician Assistant

## 2023-09-05 NOTE — Telephone Encounter (Signed)
 Please see pt msg and advise if anything further is needed

## 2023-11-09 DIAGNOSIS — Z1231 Encounter for screening mammogram for malignant neoplasm of breast: Secondary | ICD-10-CM | POA: Diagnosis not present

## 2024-03-11 ENCOUNTER — Encounter: Admitting: Family Medicine
